# Patient Record
Sex: Female | Born: 1945
Health system: Southern US, Community
[De-identification: ages and names within clinical notes are randomized; demographics above are authoritative.]

## PROBLEM LIST (undated history)

## (undated) DIAGNOSIS — C801 Malignant (primary) neoplasm, unspecified: Secondary | ICD-10-CM

## (undated) DIAGNOSIS — R52 Pain, unspecified: Secondary | ICD-10-CM

## (undated) DIAGNOSIS — I209 Angina pectoris, unspecified: Secondary | ICD-10-CM

## (undated) DIAGNOSIS — K649 Unspecified hemorrhoids: Secondary | ICD-10-CM

## (undated) DIAGNOSIS — E7800 Pure hypercholesterolemia, unspecified: Secondary | ICD-10-CM

## (undated) DIAGNOSIS — K635 Polyp of colon: Secondary | ICD-10-CM

## (undated) DIAGNOSIS — F419 Anxiety disorder, unspecified: Secondary | ICD-10-CM

## (undated) DIAGNOSIS — C4490 Unspecified malignant neoplasm of skin, unspecified: Secondary | ICD-10-CM

## (undated) DIAGNOSIS — M1990 Unspecified osteoarthritis, unspecified site: Secondary | ICD-10-CM

## (undated) DIAGNOSIS — E785 Hyperlipidemia, unspecified: Secondary | ICD-10-CM

## (undated) DIAGNOSIS — K219 Gastro-esophageal reflux disease without esophagitis: Secondary | ICD-10-CM

## (undated) HISTORY — DX: Anxiety disorder, unspecified: F41.9

## (undated) HISTORY — DX: Unspecified hemorrhoids: K64.9

## (undated) HISTORY — PX: TOTAL HIP ARTHROPLASTY: SHX124

## (undated) HISTORY — DX: Pure hypercholesterolemia, unspecified: E78.00

## (undated) HISTORY — DX: Unspecified malignant neoplasm of skin, unspecified: C44.90

## (undated) HISTORY — DX: Pain, unspecified: R52

---

## 1998-05-31 DIAGNOSIS — C801 Malignant (primary) neoplasm, unspecified: Secondary | ICD-10-CM

## 1998-05-31 HISTORY — DX: Malignant (primary) neoplasm, unspecified: C80.1

## 1998-05-31 HISTORY — PX: ABDOMINAL HYSTERECTOMY: SHX81

## 1998-11-24 HISTORY — PX: TOTAL ABDOMINAL HYSTERECTOMY W/ BILATERAL SALPINGOOPHORECTOMY: SHX83

## 1999-01-27 ENCOUNTER — Ambulatory Visit: Admission: RE | Admit: 1999-01-27 | Discharge: 1999-01-27 | Payer: Self-pay | Admitting: Gynecology

## 2000-11-14 ENCOUNTER — Encounter: Payer: Self-pay | Admitting: Obstetrics and Gynecology

## 2000-11-14 ENCOUNTER — Ambulatory Visit (HOSPITAL_COMMUNITY): Admission: RE | Admit: 2000-11-14 | Discharge: 2000-11-14 | Payer: Self-pay | Admitting: Obstetrics and Gynecology

## 2001-11-20 ENCOUNTER — Ambulatory Visit (HOSPITAL_COMMUNITY): Admission: RE | Admit: 2001-11-20 | Discharge: 2001-11-20 | Payer: Self-pay | Admitting: Obstetrics & Gynecology

## 2001-11-20 ENCOUNTER — Encounter: Payer: Self-pay | Admitting: Obstetrics & Gynecology

## 2002-12-05 ENCOUNTER — Encounter: Payer: Self-pay | Admitting: Obstetrics & Gynecology

## 2002-12-05 ENCOUNTER — Ambulatory Visit (HOSPITAL_COMMUNITY): Admission: RE | Admit: 2002-12-05 | Discharge: 2002-12-05 | Payer: Self-pay | Admitting: Obstetrics & Gynecology

## 2002-12-14 ENCOUNTER — Encounter: Payer: Self-pay | Admitting: Internal Medicine

## 2002-12-14 ENCOUNTER — Ambulatory Visit (HOSPITAL_COMMUNITY): Admission: RE | Admit: 2002-12-14 | Discharge: 2002-12-14 | Payer: Self-pay | Admitting: Internal Medicine

## 2002-12-17 ENCOUNTER — Encounter: Payer: Self-pay | Admitting: Internal Medicine

## 2002-12-17 ENCOUNTER — Ambulatory Visit (HOSPITAL_COMMUNITY): Admission: RE | Admit: 2002-12-17 | Discharge: 2002-12-17 | Payer: Self-pay | Admitting: Internal Medicine

## 2003-12-16 ENCOUNTER — Ambulatory Visit (HOSPITAL_COMMUNITY): Admission: RE | Admit: 2003-12-16 | Discharge: 2003-12-16 | Payer: Self-pay | Admitting: Obstetrics and Gynecology

## 2004-10-16 ENCOUNTER — Inpatient Hospital Stay (HOSPITAL_COMMUNITY): Admission: RE | Admit: 2004-10-16 | Discharge: 2004-10-20 | Payer: Self-pay | Admitting: Orthopedic Surgery

## 2004-12-17 ENCOUNTER — Ambulatory Visit (HOSPITAL_COMMUNITY): Admission: RE | Admit: 2004-12-17 | Discharge: 2004-12-17 | Payer: Self-pay | Admitting: Internal Medicine

## 2005-12-20 ENCOUNTER — Ambulatory Visit (HOSPITAL_COMMUNITY): Admission: RE | Admit: 2005-12-20 | Discharge: 2005-12-20 | Payer: Self-pay | Admitting: Internal Medicine

## 2006-02-14 ENCOUNTER — Ambulatory Visit: Payer: Self-pay | Admitting: Internal Medicine

## 2006-02-14 ENCOUNTER — Ambulatory Visit (HOSPITAL_COMMUNITY): Admission: RE | Admit: 2006-02-14 | Discharge: 2006-02-14 | Payer: Self-pay | Admitting: Internal Medicine

## 2007-01-03 ENCOUNTER — Ambulatory Visit (HOSPITAL_COMMUNITY): Admission: RE | Admit: 2007-01-03 | Discharge: 2007-01-03 | Payer: Self-pay | Admitting: Internal Medicine

## 2007-05-04 ENCOUNTER — Ambulatory Visit (HOSPITAL_COMMUNITY): Admission: RE | Admit: 2007-05-04 | Discharge: 2007-05-04 | Payer: Self-pay | Admitting: Orthopedic Surgery

## 2007-12-24 ENCOUNTER — Emergency Department (HOSPITAL_COMMUNITY): Admission: EM | Admit: 2007-12-24 | Discharge: 2007-12-24 | Payer: Self-pay | Admitting: Emergency Medicine

## 2008-01-17 ENCOUNTER — Ambulatory Visit (HOSPITAL_COMMUNITY): Admission: RE | Admit: 2008-01-17 | Discharge: 2008-01-17 | Payer: Self-pay | Admitting: Internal Medicine

## 2008-01-24 ENCOUNTER — Other Ambulatory Visit: Admission: RE | Admit: 2008-01-24 | Discharge: 2008-01-24 | Payer: Self-pay | Admitting: Obstetrics and Gynecology

## 2008-01-26 ENCOUNTER — Ambulatory Visit (HOSPITAL_COMMUNITY): Admission: RE | Admit: 2008-01-26 | Discharge: 2008-01-26 | Payer: Self-pay | Admitting: Obstetrics & Gynecology

## 2008-01-29 ENCOUNTER — Encounter (HOSPITAL_COMMUNITY)
Admission: RE | Admit: 2008-01-29 | Discharge: 2008-02-28 | Payer: Self-pay | Admitting: Physical Medicine and Rehabilitation

## 2008-03-01 ENCOUNTER — Encounter (HOSPITAL_COMMUNITY)
Admission: RE | Admit: 2008-03-01 | Discharge: 2008-03-31 | Payer: Self-pay | Admitting: Physical Medicine and Rehabilitation

## 2008-04-01 ENCOUNTER — Encounter (HOSPITAL_COMMUNITY)
Admission: RE | Admit: 2008-04-01 | Discharge: 2008-04-17 | Payer: Self-pay | Admitting: Physical Medicine and Rehabilitation

## 2008-04-29 ENCOUNTER — Ambulatory Visit (HOSPITAL_COMMUNITY): Admission: RE | Admit: 2008-04-29 | Discharge: 2008-04-29 | Payer: Self-pay | Admitting: Internal Medicine

## 2009-05-31 HISTORY — PX: EYE SURGERY: SHX253

## 2009-06-20 ENCOUNTER — Ambulatory Visit (HOSPITAL_COMMUNITY): Admission: RE | Admit: 2009-06-20 | Discharge: 2009-06-20 | Payer: Self-pay | Admitting: Internal Medicine

## 2009-06-25 ENCOUNTER — Ambulatory Visit (HOSPITAL_COMMUNITY): Admission: RE | Admit: 2009-06-25 | Discharge: 2009-06-25 | Payer: Self-pay | Admitting: Internal Medicine

## 2009-06-26 ENCOUNTER — Ambulatory Visit (HOSPITAL_COMMUNITY): Admission: RE | Admit: 2009-06-26 | Discharge: 2009-06-26 | Payer: Self-pay | Admitting: Internal Medicine

## 2009-12-12 ENCOUNTER — Inpatient Hospital Stay (HOSPITAL_COMMUNITY): Admission: RE | Admit: 2009-12-12 | Discharge: 2009-12-15 | Payer: Self-pay | Admitting: Orthopedic Surgery

## 2010-01-02 ENCOUNTER — Encounter (HOSPITAL_COMMUNITY)
Admission: RE | Admit: 2010-01-02 | Discharge: 2010-02-01 | Payer: Self-pay | Source: Home / Self Care | Admitting: Orthopedic Surgery

## 2010-04-16 ENCOUNTER — Other Ambulatory Visit: Admission: RE | Admit: 2010-04-16 | Discharge: 2010-04-16 | Payer: Self-pay | Admitting: Obstetrics and Gynecology

## 2010-06-21 ENCOUNTER — Encounter (INDEPENDENT_AMBULATORY_CARE_PROVIDER_SITE_OTHER): Payer: Self-pay | Admitting: Internal Medicine

## 2010-06-22 ENCOUNTER — Ambulatory Visit (HOSPITAL_COMMUNITY)
Admission: RE | Admit: 2010-06-22 | Discharge: 2010-06-22 | Payer: Self-pay | Source: Home / Self Care | Attending: Internal Medicine | Admitting: Internal Medicine

## 2010-08-15 LAB — CBC
HCT: 25.9 % — ABNORMAL LOW (ref 36.0–46.0)
HCT: 27.2 % — ABNORMAL LOW (ref 36.0–46.0)
Hemoglobin: 8.7 g/dL — ABNORMAL LOW (ref 12.0–15.0)
MCHC: 34 g/dL (ref 30.0–36.0)
MCV: 94.8 fL (ref 78.0–100.0)
MCV: 95.1 fL (ref 78.0–100.0)
Platelets: 139 10*3/uL — ABNORMAL LOW (ref 150–400)
Platelets: 144 10*3/uL — ABNORMAL LOW (ref 150–400)
RBC: 2.86 MIL/uL — ABNORMAL LOW (ref 3.87–5.11)
RDW: 14 % (ref 11.5–15.5)
RDW: 14.2 % (ref 11.5–15.5)
RDW: 14.6 % (ref 11.5–15.5)
WBC: 8.4 10*3/uL (ref 4.0–10.5)
WBC: 8.8 10*3/uL (ref 4.0–10.5)

## 2010-08-15 LAB — PROTIME-INR
INR: 1.13 (ref 0.00–1.49)
INR: 1.56 — ABNORMAL HIGH (ref 0.00–1.49)
INR: 1.8 — ABNORMAL HIGH (ref 0.00–1.49)
Prothrombin Time: 14.4 seconds (ref 11.6–15.2)
Prothrombin Time: 18.5 seconds — ABNORMAL HIGH (ref 11.6–15.2)

## 2010-08-15 LAB — BASIC METABOLIC PANEL
BUN: 6 mg/dL (ref 6–23)
BUN: 6 mg/dL (ref 6–23)
CO2: 31 mEq/L (ref 19–32)
Calcium: 7.9 mg/dL — ABNORMAL LOW (ref 8.4–10.5)
Chloride: 102 mEq/L (ref 96–112)
Chloride: 97 mEq/L (ref 96–112)
Chloride: 99 mEq/L (ref 96–112)
Creatinine, Ser: 0.68 mg/dL (ref 0.4–1.2)
Creatinine, Ser: 0.76 mg/dL (ref 0.4–1.2)
GFR calc Af Amer: >60 mL/min (ref >60)
GFR calc Af Amer: >60 mL/min (ref >60)
Potassium: 3.2 mEq/L — ABNORMAL LOW (ref 3.5–5.1)
Potassium: 4 mEq/L (ref 3.5–5.1)
Sodium: 130 mEq/L — ABNORMAL LOW (ref 135–145)

## 2010-08-16 LAB — CBC
HCT: 41.9 % (ref 36.0–46.0)
Hemoglobin: 14.1 g/dL (ref 12.0–15.0)
RDW: 14.4 % (ref 11.5–15.5)
WBC: 9.8 10*3/uL (ref 4.0–10.5)

## 2010-08-16 LAB — URINALYSIS, ROUTINE W REFLEX MICROSCOPIC
Glucose, UA: NEGATIVE mg/dL
Hgb urine dipstick: NEGATIVE
Specific Gravity, Urine: 1.034 — ABNORMAL HIGH (ref 1.005–1.030)
pH: 6 (ref 5.0–8.0)

## 2010-08-16 LAB — COMPREHENSIVE METABOLIC PANEL
ALT: 31 U/L (ref 0–35)
AST: 24 U/L (ref 0–37)
Albumin: 3.9 g/dL (ref 3.5–5.2)
Alkaline Phosphatase: 70 U/L (ref 39–117)
GFR calc Af Amer: >60 mL/min (ref >60)
Glucose, Bld: 67 mg/dL — ABNORMAL LOW (ref 70–99)
Potassium: 4.4 mEq/L (ref 3.5–5.1)
Sodium: 142 mEq/L (ref 135–145)
Total Protein: 6.4 g/dL (ref 6.0–8.3)

## 2010-08-16 LAB — DIFFERENTIAL
Basophils Relative: 1 % (ref 0–1)
Eosinophils Absolute: 0.2 10*3/uL (ref 0.0–0.7)
Eosinophils Relative: 2 % (ref 0–5)
Monocytes Absolute: 0.8 10*3/uL (ref 0.1–1.0)
Monocytes Relative: 8 % (ref 3–12)
Neutrophils Relative %: 62 % (ref 43–77)

## 2010-08-16 LAB — URINE MICROSCOPIC-ADD ON

## 2010-08-16 LAB — SURGICAL PCR SCREEN: MRSA, PCR: NEGATIVE

## 2010-08-16 LAB — TYPE AND SCREEN: ABO/RH(D): A POS

## 2010-10-16 NOTE — Discharge Summary (Signed)
Tami Love, Tami Love NO.:  [id number]   MEDICAL RECORD NO.:  [id number]          PATIENT TYPE:  INP   LOCATION:  5031                         FACILITY:  [facility]   PHYSICIAN:  Harvie Junior, M.D.   DATE OF BIRTH:  1946/04/20   DATE OF ADMISSION:  10/16/2004  DATE OF DISCHARGE:  10/20/2004                                 DISCHARGE SUMMARY   ADMISSION DIAGNOSES:  1.  End-stage degenerative joint disease/avascular necrosis, left hip.  2.  Gastroesophageal reflux disease.  3.  Hyperlipidemia.   DISCHARGE DIAGNOSES:  1.  End-stage degenerative joint disease/avascular necrosis, left hip.  2.  Gastroesophageal reflux disease.  3.  Hyperlipidemia.   ALLERGIES:  IVP DYE causes swelling.   PROCEDURE:  Left total hip arthroplasty with S-ROM prosthesis, Jodi Geralds,  M.D. on Oct 16, 2004.   HISTORY OF PRESENT ILLNESS:  Tami Love is a pleasant, 65 year old patient  who presented to our office with a long history of progressive left hip pain  in the groin and limitation of motion.  She had night pain and pain with  ambulation that has gotten progressively worse over the past few years and  in the past few months it has gotten to the point where she was ready to get  it taken care of.  She tried modifying her activity and use of over-the-  counter antiinflammatory medications, but continued to have pain in her hip.  Her x-ray finding showed bone on bone degenerative arthritis of the left hip  with some findings suggesting avascular necrosis of the femoral head.  Based  upon the radiographic and clinical findings, she was felt to be a candidate  for a left total hip arthroplasty and she was admitted for this.   LABORATORY DATA AND X-RAY FINDINGS:  EKG on admission showed normal sinus  rhythm with nonspecific ST and T wave changes.  No previous tracing to  compare.  Postoperative x-ray of the left hip showed left total hip  arthroplasty without complications.   Hemoglobin on admission was 12.9, hematocrit 38.3 and indices within normal  limits.  On postop day #1, hemoglobin 10.1, postop day #2, 9.5, postop day  #3, 9.1.  Protime on admission was 12.3 seconds with an INR of 0.9.  Her PTT  was 24.  On the date of discharge, on Coumadin therapy, her protime was 19.1  seconds with an INR of 2.0.  CMET on admission was within normal limits.  Urinalysis on admission showed no abnormalities other than moderate  leukocyte esterase, few epithelials and 3-6 wbc's per high power field with  few bacteria.   HOSPITAL COURSE:  The patient was brought to the operating room on Oct 16, 2004.  Preoperatively, she was given 1 g of Ancef and gentamicin 80 mg IV.  She was brought to the operating room where she underwent left total hip  arthroplasty as well described in Dr. Luiz Blare' operative note.  She was put  on PCA morphine pump for pain control and was given 5 doses of IV Ancef 1 g  q.8h.  Physical therapy evaluated  the patient for walker ambulation with  weightbearing as tolerated on the left.  On postop day #1, she is resting  comfortably.  She had no complaints.  Her vital signs are stable.  She is  afebrile.  Her INR was 1.0 and her hemoglobin was 10.1.  She had good  neurovascular status to her left lower extremity.  She has gotten out of bed  to the chair.  Incentive spirometry was used postoperatively.  On postop day  #2, the patient was resting comfortably.  She had spiked a fever up to 103.4  and was then found to be afebrile.  Her hemoglobin was stable at 9.5 and her  INR was 1.6 on Coumadin therapy.  Her incision was clean and dry.  Her PCA  morphine pump was discontinued and her IV was converted to a saline lock.  Her dressing was changed.  She overall was doing well on postop day #3 with  a stable hemoglobin and INR of 1.6.  Her Percocet was changed to p.o.  Demerol for a little better pain control.  On postop day #4, she had minimal  hip pain.  She  was taken fluids and voiding without difficulty.  Her vital  signs are stable.  She is afebrile.  Temperature was 99.3 at this point.  Her left hip wound was benign.  Neurovascular status was intact to the left  lower extremity.  Her INR was 2.0.  Her dressing was changed.  She was  discharged home in improved condition.   DIET:  Regular.   DISCHARGE MEDICATIONS:  1.  Mepergan Fortis p.r.n. pain one to two q.6h. p.r.n. pain.  2.  Coumadin x1 month postop for DVT prophylaxis and this will be managed by      home health agency.   ACTIVITY:  She will need home health physical therapy.  She is instructed to  ambulate weightbearing as tolerated on the left with a walker.  Keep her  wound dry.   FOLLOW UP:  Follow up with Dr. Luiz Blare in his office in 10 days.  Call with  any problems that occur.      Marshia Ly, P.A.      Harvie Junior, M.D.  Electronically Signed    JB/MEDQ  D:  12/23/2004  T:  12/23/2004  Job:  [id number]   cc:   Kingsley Callander. Ouida Sills, MD  [address]  [place]  [place] [address]  Fax: [phone]

## 2010-10-16 NOTE — Op Note (Signed)
[name], Love NO.:  [id number]   MEDICAL RECORD NO.:  [id number]          PATIENT TYPE:  INP   LOCATION:  2550                         FACILITY:  [facility]   PHYSICIAN:  Harvie Junior, M.D.   DATE OF BIRTH:  [birth year]   DATE OF PROCEDURE:  10/16/2004  DATE OF DISCHARGE:                                 OPERATIVE REPORT   PREOPERATIVE DIAGNOSIS:  End-stage degenerative joint disease, left hip with  arteriovenous malformation.   POSTOPERATIVE DIAGNOSIS:  End-stage degenerative joint disease, left hip  with arteriovenous malformation.   PRINCIPAL PROCEDURE:  Left total hip replacement; with Laural Benes [employer] Laural Benes S-  ROM prosthesis, a 50 mm cup, a 50 mm 10-degree hooded liner, a 28 mm  Plus-0  hip ball, a 36 Plus-12 offset 1813 stem.   SURGEON:  Harvie Junior, M.D.   ASSISTANT:  Marshia Ly, P.A.   ANESTHESIA:  General.   BRIEF HISTORY:  The patient is a 65 year old female with a long history of  having significant left hip pain.  She had been followed for a long period  of time conservatively because of continued complaints of pain related to  AVM.  She is now taken to the operating room for left total hip replacement  after failure of all conservative care.   DESCRIPTION OF PROCEDURE:  The patient taken to the operating room and after  general endotracheal anesthesia obtained, the patient placed on operating  room table and the left hip prepped and draped in the usual sterile fashion.  After the patient had been rolled over the right lateral decubitus position,  all bony prominences were well padded and axillary roll had been put in  place.  Attention then turned to the left hip, where a lateral incision was  made for a posterior approach to the hip. Subcutaneous tissues were  dissected down to the level of the tensor fascia, divided in line with its  fibers; and a posterior approach to the hip was made.  Short external  rotators and piriform were  intact.  Attention was then turned towards the  posterior aspect of the hip.  The capsule was taken down and tagged.  The  hip ball was then cut; the neck was cut.  This was a fairly low neck cut, as  this had been tem plated as necessary to re-establish the hip center.  Once  this was accomplished, the attention was turned towards the reaming of the  distal canal.  The acetabulum was initially sequentially reamed to a level  of 49 of 50.  A cup was put in place.  We initially wanted to get to 52, but  just was not bone stock to handle this, and at 49 was the last reamer.  A 50  cup was hammered into place and 45-degrees of lateral opening and 30 degrees  of anteversion.  Following this, attention was turned towards the stem,  which was sequentially reamed up to a level of 13, and a 13.5 reamer was  taken two-thirds of the way down.  Attention was then turned proximally,  where an 18-B large cone was in fact used, and then an 18-B trial was put in  place.  The Plus-8 stem was used, and this did give some tendency towards  dislocation; which did not appear too tight laterally, so a Plus-12 stem was  used -- this gave excellent range of motion and stability.  At that point  the final components were put in place.  A 10-degree lip liner posteriorly,  a 36 Plus-12 stem with a small degree of anteversion, and a 36 Plus-12 stem  was hammered into place.  Excellent stability was achieved, and the final  range of motion was excellent, with no tendency towards instability.   At this point the wound was copiously irrigated and suctioned dry.  The  short external rotators and posterior capsule were repaired to the posterior  trochanteric line with Ethibond interrupted sutures.  The tensor fascia with  a long Vicryl running suture; the skin with 0 and 2-0 Vicryl, and the skin  with skin staples.  A sterile compressive dressing was applied, as well as a  knee immobilizer.   The patient was taken to  the recovery room and noted to be in satisfactory  condition.   ESTIMATED BLOOD LOSS:  250 cc.       JLG/MEDQ  D:  10/16/2004  T:  10/16/2004  Job:  [id number]

## 2010-10-16 NOTE — Op Note (Signed)
NAMEKIMBERLIE, Tami Love               ACCOUNT NO.:  [id number]   MEDICAL RECORD NO.:  [id number]          PATIENT TYPE:  AMB   LOCATION:  DAY                           FACILITY:  [facility]   PHYSICIAN:  Lionel December, M.D.    DATE OF BIRTH:  [birth year]   DATE OF PROCEDURE:  02/14/2006  DATE OF DISCHARGE:                                 OPERATIVE REPORT   PROCEDURE:  Colonoscopy.   INDICATION:  Kailynn is a 65 year old Caucasian female with history of  colonic polyps, whose last exam was about 5 years ago under fluoroscopy.  The procedures were reviewed with the patient and informed consent was  obtained.   MEDICATIONS FOR CONSCIOUS SEDATION:  Demerol 50 mg IV, Versed 8 mg IV.   FINDINGS:  Procedure performed in endoscopy suite.  The patient's vital  signs and O2 sat were monitored during the procedure and remained stable.  The patient was placed in left lateral recumbent position.  Rectal  examination performed.  No abnormality noted in external or digital exam.  Olympus videoscope was placed in rectum, advanced under vision into sigmoid  colon where there were a few pieces of formed stool and multiple  diverticula.  Scope was repeatedly withdrawn with the torque to keep it  straight.  Scope was passed into cecum, which was identified by appendiceal  orifice and ileocecal valve.  Pictures taken for the record.  As the scope  was withdrawn, colonic mucosa was carefully examined and there were no  polyps or tumor masses noted.  Rectal mucosa was normal.  Scope was  retroflexed to examine anorectal junction which was unremarkable.  Endoscope  was straightened and withdrawn.  The patient tolerated the procedure well.   FINAL DIAGNOSES:  1. No evidence of recurrent polyps.  2. Extensive sigmoid colon diverticulosis.   RECOMMENDATIONS:  She should continue yearly Hemoccults and consider next  exam in 5-7 years.   No evidence of recurrent polyps.  Extensive sigmoid colon diverticula.   Sigmoid diverticulosis.   RECOMMENDATIONS:  1. Yearly Hemoccults.  2. High-fiber diet with fiber supplement 3-4 grams daily.  3. Next exam in 5-7 years from now.      Lionel December, M.D.  Electronically Signed     NR/MEDQ  D:  02/14/2006  T:  02/14/2006  Job:  [id number]   cc:   Kingsley Callander. Ouida Sills, MD  Fax: [phone]

## 2010-11-30 ENCOUNTER — Other Ambulatory Visit (HOSPITAL_COMMUNITY): Payer: Self-pay | Admitting: Internal Medicine

## 2010-11-30 DIAGNOSIS — N644 Mastodynia: Secondary | ICD-10-CM

## 2010-12-16 ENCOUNTER — Ambulatory Visit (HOSPITAL_COMMUNITY)
Admission: RE | Admit: 2010-12-16 | Discharge: 2010-12-16 | Disposition: A | Discharge status: Home / Self Care | Payer: Medicare Other | Source: Ambulatory Visit | Attending: Internal Medicine | Admitting: Internal Medicine

## 2010-12-16 DIAGNOSIS — N644 Mastodynia: Secondary | ICD-10-CM

## 2011-02-02 ENCOUNTER — Encounter (INDEPENDENT_AMBULATORY_CARE_PROVIDER_SITE_OTHER): Payer: Self-pay | Admitting: *Deleted

## 2011-02-04 ENCOUNTER — Telehealth (INDEPENDENT_AMBULATORY_CARE_PROVIDER_SITE_OTHER): Payer: Self-pay | Admitting: *Deleted

## 2011-02-04 DIAGNOSIS — Z86010 Personal history of colonic polyps: Secondary | ICD-10-CM

## 2011-02-04 NOTE — Telephone Encounter (Addendum)
Waiting for NUR to sign triage and let me know which type of prep patients needs, then will need to mail & e-scribe  TCS sch'd 04/29/11 @ 9:30 (8:30)

## 2011-02-26 LAB — DIFFERENTIAL
Basophils Relative: 0
Eosinophils Absolute: 0.3
Eosinophils Relative: 3
Lymphs Abs: 2.9
Monocytes Relative: 8
Neutrophils Relative %: 51

## 2011-02-26 LAB — BASIC METABOLIC PANEL
BUN: 19
CO2: 29
Chloride: 108
Creatinine, Ser: 0.69
Potassium: 4.1

## 2011-02-26 LAB — POCT CARDIAC MARKERS: Myoglobin, poc: 31.1

## 2011-02-26 LAB — CBC
HCT: 38.6
MCHC: 33.3
MCV: 93.4
Platelets: 237
RBC: 4.13

## 2011-03-16 ENCOUNTER — Encounter (INDEPENDENT_AMBULATORY_CARE_PROVIDER_SITE_OTHER): Payer: Self-pay | Admitting: *Deleted

## 2011-03-16 ENCOUNTER — Telehealth (INDEPENDENT_AMBULATORY_CARE_PROVIDER_SITE_OTHER): Payer: Self-pay | Admitting: *Deleted

## 2011-03-16 NOTE — Telephone Encounter (Signed)
Per dr Karilyn Cota ok to have osmo pill prep, instructions mailed

## 2011-03-16 NOTE — Telephone Encounter (Signed)
Patient need osmo pill prep

## 2011-03-17 ENCOUNTER — Other Ambulatory Visit (INDEPENDENT_AMBULATORY_CARE_PROVIDER_SITE_OTHER): Payer: Self-pay | Admitting: Internal Medicine

## 2011-03-17 MED ORDER — SOD PHOS MONO-SOD PHOS DIBASIC 1.102-0.398 G PO TABS: 1.0000 | ORAL_TABLET | Freq: Once | ORAL | Status: DC

## 2011-03-17 NOTE — Telephone Encounter (Signed)
Will resend

## 2011-04-05 ENCOUNTER — Other Ambulatory Visit (INDEPENDENT_AMBULATORY_CARE_PROVIDER_SITE_OTHER): Payer: Self-pay | Admitting: *Deleted

## 2011-04-05 DIAGNOSIS — Z86010 Personal history of colonic polyps: Secondary | ICD-10-CM

## 2011-04-16 ENCOUNTER — Encounter (HOSPITAL_COMMUNITY): Payer: Self-pay | Admitting: Pharmacy Technician

## 2011-04-20 ENCOUNTER — Telehealth (INDEPENDENT_AMBULATORY_CARE_PROVIDER_SITE_OTHER): Payer: Self-pay | Admitting: *Deleted

## 2011-04-20 NOTE — Telephone Encounter (Signed)
PCP/Requesting MD:  Ouida Sills  Name: Tami Love  DOB: [birth year]  Home Phone: [phone]      Procedure: TCS  Reason/Indication:  SURVEILLANCE, HX POLYPS  Has patient had this procedure before?  YES  If so, when, by whom and where?  9/07  Is there a family history of colon cancer?  NO  Who?  What age when diagnosed?    Is patient diabetic?   NO      Does patient have prosthetic heart valve?  NO  Do you have a pacemaker?  NO  Has patient had joint replacement within last 12 months?  NO  Is patient on Coumadin, Plavix and/or Aspirin? NO  Medications: CITALOPRAM 20 MG DAILY, SIMVASTATIN 40 MG DAILY, OMEPRAZOLE 20 MG DAILY, EXTRA STRENGTH TYLENOL DAILY  Allergies: IVP DYE  Pharmacy: [facility]  Medication Adjustment: NONE  Procedure date & time: 04/29/11 @ 9:30  Patient stated she thinks last TCS was under fluoro, if so, will this one need to be with fluoro, per Dr Karilyn Cota last TCS was not under fluoro therefore not needed.Marland KitchenMarland KitchenMarland Kitchen

## 2011-04-21 NOTE — Telephone Encounter (Signed)
Agree with colonoscopy.

## 2011-04-26 ENCOUNTER — Other Ambulatory Visit: Payer: Self-pay | Admitting: Adult Health

## 2011-04-26 ENCOUNTER — Other Ambulatory Visit (HOSPITAL_COMMUNITY)
Admission: RE | Admit: 2011-04-26 | Discharge: 2011-04-26 | Disposition: A | Discharge status: Home / Self Care | Payer: Medicare Other | Source: Ambulatory Visit | Attending: Obstetrics and Gynecology | Admitting: Obstetrics and Gynecology

## 2011-04-26 DIAGNOSIS — Z124 Encounter for screening for malignant neoplasm of cervix: Secondary | ICD-10-CM | POA: Insufficient documentation

## 2011-04-28 MED ORDER — SODIUM CHLORIDE 0.45 % IV SOLN
Freq: Once | INTRAVENOUS | Status: AC
  Administered 2011-04-29: 09:00:00 via INTRAVENOUS

## 2011-04-29 ENCOUNTER — Encounter (HOSPITAL_COMMUNITY)
Admission: RE | Disposition: A | Discharge status: Home / Self Care | Payer: Self-pay | Source: Ambulatory Visit | Attending: Internal Medicine

## 2011-04-29 ENCOUNTER — Other Ambulatory Visit (INDEPENDENT_AMBULATORY_CARE_PROVIDER_SITE_OTHER): Payer: Self-pay | Admitting: Internal Medicine

## 2011-04-29 ENCOUNTER — Ambulatory Visit (HOSPITAL_COMMUNITY)
Admission: RE | Admit: 2011-04-29 | Discharge: 2011-04-29 | Disposition: A | Discharge status: Home / Self Care | Payer: Medicare Other | Source: Ambulatory Visit | Attending: Internal Medicine | Admitting: Internal Medicine

## 2011-04-29 ENCOUNTER — Encounter (HOSPITAL_COMMUNITY): Payer: Self-pay | Admitting: *Deleted

## 2011-04-29 DIAGNOSIS — D126 Benign neoplasm of colon, unspecified: Secondary | ICD-10-CM | POA: Insufficient documentation

## 2011-04-29 DIAGNOSIS — Z860100 Personal history of colon polyps, unspecified: Secondary | ICD-10-CM | POA: Insufficient documentation

## 2011-04-29 DIAGNOSIS — E785 Hyperlipidemia, unspecified: Secondary | ICD-10-CM | POA: Insufficient documentation

## 2011-04-29 DIAGNOSIS — K5730 Diverticulosis of large intestine without perforation or abscess without bleeding: Secondary | ICD-10-CM | POA: Insufficient documentation

## 2011-04-29 DIAGNOSIS — Z09 Encounter for follow-up examination after completed treatment for conditions other than malignant neoplasm: Secondary | ICD-10-CM | POA: Insufficient documentation

## 2011-04-29 HISTORY — DX: Gastro-esophageal reflux disease without esophagitis: K21.9

## 2011-04-29 HISTORY — DX: Hyperlipidemia, unspecified: E78.5

## 2011-04-29 HISTORY — PX: COLONOSCOPY: SHX5424

## 2011-04-29 HISTORY — DX: Angina pectoris, unspecified: I20.9

## 2011-04-29 HISTORY — DX: Polyp of colon: K63.5

## 2011-04-29 SURGERY — COLONOSCOPY
Anesthesia: Moderate Sedation

## 2011-04-29 MED ORDER — MIDAZOLAM HCL 5 MG/5ML IJ SOLN
INTRAMUSCULAR | Status: DC | PRN
  Administered 2011-04-29 (×2): 2 mg via INTRAVENOUS
  Administered 2011-04-29: 1 mg via INTRAVENOUS

## 2011-04-29 MED ORDER — MIDAZOLAM HCL 5 MG/5ML IJ SOLN
INTRAMUSCULAR | Status: AC
  Filled 2011-04-29: qty 10

## 2011-04-29 MED ORDER — STERILE WATER FOR IRRIGATION IR SOLN
Status: DC | PRN
  Administered 2011-04-29: 09:00:00

## 2011-04-29 MED ORDER — MEPERIDINE HCL 50 MG/ML IJ SOLN
INTRAMUSCULAR | Status: AC
  Filled 2011-04-29: qty 1

## 2011-04-29 MED ORDER — MEPERIDINE HCL 50 MG/ML IJ SOLN
INTRAMUSCULAR | Status: DC | PRN
  Administered 2011-04-29 (×2): 25 mg via INTRAVENOUS

## 2011-04-29 NOTE — H&P (Signed)
Tami Love is an 65 y.o. female.   Chief Complaint: Patient is here for colonoscopy. HPI: Patient is 65 year old Caucasian female with history of colonic polyps. Her last exam was in September 2007 and was negative for colonic polyps. He denies abdominal pain rectal bleeding. She has irregular bowel movements; either diarrhea or constipation. Family  history is negative for colorectal carcinoma.  Past Medical History  Diagnosis Date  . Hyperlipidemia   . GERD (gastroesophageal reflux disease)   . Angina     remote history  . Colon polyps     Past Surgical History  Procedure Date  . Abdominal hysterectomy   . Total hip arthroplasty 2010, 2007    bilateral    Family History  Problem Relation Age of Onset  . Colon cancer Neg Hx    Social History:  reports that she has never smoked. She does not have any smokeless tobacco history on file. She reports that she drinks about 8.5 ounces of alcohol per week. She reports that she does not use illicit drugs.  Allergies:  Allergies  Allergen Reactions  . Iohexol   . Ivp Dye (Iodinated Diagnostic Agents)     Medications Prior to Admission  Medication Dose Route Frequency Provider Last Rate Last Dose  . 0.45 % sodium chloride infusion   Intravenous Once Malissa Hippo, MD 20 mL/hr at 04/29/11 0843    . meperidine (DEMEROL) 50 MG/ML injection           . midazolam (VERSED) 5 MG/5ML injection           . simethicone susp in sterile water 1000 mL irrigation    PRN Malissa Hippo, MD       No current outpatient prescriptions on file as of 04/29/2011.    No results found for this or any previous visit (from the past 48 hour(s)). No results found.  Review of Systems  Constitutional: Negative for weight loss.  Gastrointestinal: Negative for abdominal pain, blood in stool and melena.    Blood pressure 148/66, pulse 62, temperature 98.1 F (36.7 C), temperature source Oral, resp. rate 16, height 5' 6.5" (1.689 m), weight 185 lb  (83.915 kg), SpO2 100.00%. Physical Exam  Constitutional: She appears well-developed and well-nourished.  HENT:  Mouth/Throat: Oropharynx is clear and moist.  Eyes: Conjunctivae are normal. No scleral icterus.  Neck: No thyromegaly present.  Cardiovascular: Regular rhythm and normal heart sounds.   No murmur heard. Respiratory: Effort normal and breath sounds normal.  GI: Soft. She exhibits no distension and no mass. There is no tenderness.  Musculoskeletal: She exhibits no edema.  Lymphadenopathy:    She has no cervical adenopathy.  Neurological: She is alert.  Skin: Skin is warm and dry.     Assessment/Plan History of colonic adenomas. Surveillance colonoscopy  Tami Love U 04/29/2011, 9:30 AM

## 2011-04-29 NOTE — Op Note (Signed)
COLONOSCOPY PROCEDURE REPORT  PATIENT:  Tami Love  MR#:  [id number] Birthdate:  18-Oct-1945, 65 y.o., female Endoscopist:  Dr. Malissa Hippo, MD Referred By:  Dr. Carylon Perches, MD Procedure Date: 04/29/2011  Procedure:   Colonoscopy  Indications:  Patient is 65 year old Caucasian female with history of colonic colonic adenomas who is here for surveillance examination. Her last exam was in September 2007.  Informed Consent: Procedure and risks were reviewed with the patient and informed consent was obtained.  Medications:  Demerol 50 mg IV Versed 5 mg IV  Description of procedure:  After a digital rectal exam was performed, that colonoscope was advanced from the anus through the rectum and colon to the area of the cecum, ileocecal valve and appendiceal orifice. The cecum was deeply intubated. These structures were well-seen and photographed for the record. From the level of the cecum and ileocecal valve, the scope was slowly and cautiously withdrawn. The mucosal surfaces were carefully surveyed utilizing scope tip to flexion to facilitate fold flattening as needed. The scope was pulled down into the rectum where a thorough exam including retroflexion was performed.  Findings:   Prep excellent except coating of cecal mucosa with stool landmarks well identified. Multiple diverticula at sigmoid colon and few more at hepatic flexure. Small polyp ablated via cold biopsy from splenic flexure. Another small polyp ablated via cold biopsy from sigmoid colon.  Therapeutic/Diagnostic Maneuvers Performed:  See above  Complications:  None  Cecal Withdrawal Time:  14 minutes  Impression:  Examination performed to cecum. Multiple diverticula at sigmoid colon and few more at hepatic flexure. Two small polyps ablated via cold biopsy; one from splenic flexure and the second one from sigmoid colon.  Recommendations:  Standard instructions given. I will be contacting patient with results of  biopsy and further recommendations.  Dominico Rod U  04/29/2011 10:08 AM  CC: Dr. Carylon Perches, MD & Dr. Bonnetta Barry ref. provider found

## 2011-05-10 ENCOUNTER — Encounter (INDEPENDENT_AMBULATORY_CARE_PROVIDER_SITE_OTHER): Payer: Self-pay | Admitting: *Deleted

## 2011-05-10 ENCOUNTER — Encounter (HOSPITAL_COMMUNITY): Payer: Self-pay | Admitting: Internal Medicine

## 2011-07-12 ENCOUNTER — Other Ambulatory Visit (HOSPITAL_COMMUNITY): Payer: Self-pay | Admitting: Internal Medicine

## 2011-07-12 DIAGNOSIS — Z139 Encounter for screening, unspecified: Secondary | ICD-10-CM

## 2011-07-15 ENCOUNTER — Ambulatory Visit (HOSPITAL_COMMUNITY)
Admission: RE | Admit: 2011-07-15 | Discharge: 2011-07-15 | Disposition: A | Discharge status: Home / Self Care | Payer: Medicare Other | Source: Ambulatory Visit | Attending: Internal Medicine | Admitting: Internal Medicine

## 2011-07-15 DIAGNOSIS — Z1231 Encounter for screening mammogram for malignant neoplasm of breast: Secondary | ICD-10-CM | POA: Insufficient documentation

## 2011-07-15 DIAGNOSIS — Z139 Encounter for screening, unspecified: Secondary | ICD-10-CM

## 2012-04-04 ENCOUNTER — Encounter (HOSPITAL_COMMUNITY): Payer: Self-pay | Admitting: Pharmacy Technician

## 2012-04-05 NOTE — Patient Instructions (Addendum)
Your procedure is scheduled on:  04/10/12  Report to [facility] at  11:30 AM.  Call this number if you have problems the morning of surgery: [phone]   Remember:   Do not eat or drink:After Midnight.  Take these medicines the morning of surgery with A SIP OF WATER: Omeprazole   Do not wear jewelry, make-up or nail polish.  Do not wear lotions, powders, or perfumes. You may wear deodorant.  Do not shave 48 hours prior to surgery. Men may shave face and neck.  Do not bring valuables to the hospital.  Contacts, dentures or bridgework may not be worn into surgery.  Leave suitcase in the car. After surgery it may be brought to your room.  For patients admitted to the hospital, checkout time is 11:00 AM the day of discharge.   Patients discharged the day of surgery will not be allowed to drive home.    Special Instructions: Start using your eye drops before surgery as directed by your eye doctor.   Please read over the following fact sheets that you were given: Anesthesia Post-op Instructions    Cataract Surgery  A cataract is a clouding of the lens of the eye. When a lens becomes cloudy, vision is reduced based on the degree and nature of the clouding. Surgery may be needed to improve vision. Surgery removes the cloudy lens and usually replaces it with a substitute lens (intraocular lens, IOL). LET YOUR EYE DOCTOR KNOW ABOUT:  Allergies to food or medicine.  Medicines taken including herbs, eyedrops, over-the-counter medicines, and creams.  Use of steroids (by mouth or creams).  Previous problems with anesthetics or numbing medicine.  History of bleeding problems or blood clots.  Previous surgery.  Other health problems, including diabetes and kidney problems.  Possibility of pregnancy, if this applies. RISKS AND COMPLICATIONS  Infection.  Inflammation of the eyeball (endophthalmitis) that can spread to both eyes (sympathetic ophthalmia).  Poor wound healing.  If an  IOL is inserted, it can later fall out of proper position. This is very uncommon.  Clouding of the part of your eye that holds an IOL in place. This is called an "after-cataract." These are uncommon, but easily treated. BEFORE THE PROCEDURE  Do not eat or drink anything except small amounts of water for 8 to 12 before your surgery, or as directed by your caregiver.  Unless you are told otherwise, continue any eyedrops you have been prescribed.  Talk to your primary caregiver about all other medicines that you take (both prescription and non-prescription). In some cases, you may need to stop or change medicines near the time of your surgery. This is most important if you are taking blood-thinning medicine.Do not stop medicines unless you are told to do so.  Arrange for someone to drive you to and from the procedure.  Do not put contact lenses in either eye on the day of your surgery. PROCEDURE There is more than one method for safely removing a cataract. Your doctor can explain the differences and help determine which is best for you. Phacoemulsification surgery is the most common form of cataract surgery.  An injection is given behind the eye or eyedrops are given to make this a painless procedure.  A small cut (incision) is made on the edge of the clear, dome-shaped surface that covers the front of the eye (cornea).  A tiny probe is painlessly inserted into the eye. This device gives off ultrasound waves that soften and break up  the cloudy center of the lens. This makes it easier for the cloudy lens to be removed by suction.  An IOL may be implanted.  The normal lens of the eye is covered by a clear capsule. Part of that capsule is intentionally left in the eye to support the IOL.  Your surgeon may or may not use stitches to close the incision. There are other forms of cataract surgery that require a larger incision and stiches to close the eye. This approach is taken in cases where  the doctor feels that the cataract cannot be easily removed using phacoemulsification. AFTER THE PROCEDURE  When an IOL is implanted, it does not need care. It becomes a permanent part of your eye and cannot be seen or felt.  Your doctor will schedule follow-up exams to check on your progress.  Review your other medicines with your doctor to see which can be resumed after surgery.  Use eyedrops or take medicine as prescribed by your doctor. Document Released: 05/06/2011 Document Revised: 08/09/2011 Document Reviewed: 05/06/2011 [employer] Patient Information 2013 [place], [place].    PATIENT INSTRUCTIONS POST-ANESTHESIA  IMMEDIATELY FOLLOWING SURGERY:  Do not drive or operate machinery for the first twenty four hours after surgery.  Do not make any important decisions for twenty four hours after surgery or while taking narcotic pain medications or sedatives.  If you develop intractable nausea and vomiting or a severe headache please notify your doctor immediately.  FOLLOW-UP:  Please make an appointment with your surgeon as instructed. You do not need to follow up with anesthesia unless specifically instructed to do so.  WOUND CARE INSTRUCTIONS (if applicable):  Keep a dry clean dressing on the anesthesia/puncture wound site if there is drainage.  Once the wound has quit draining you may leave it open to air.  Generally you should leave the bandage intact for twenty four hours unless there is drainage.  If the epidural site drains for more than 36-48 hours please call the anesthesia department.  QUESTIONS?:  Please feel free to call your physician or the hospital operator if you have any questions, and they will be happy to assist you.

## 2012-04-06 ENCOUNTER — Inpatient Hospital Stay (HOSPITAL_COMMUNITY): Admission: RE | Admit: 2012-04-06 | Payer: Medicare Other | Source: Ambulatory Visit

## 2012-04-06 ENCOUNTER — Encounter (HOSPITAL_COMMUNITY)
Admission: RE | Admit: 2012-04-06 | Discharge: 2012-04-06 | Disposition: A | Discharge status: Home / Self Care | Payer: Medicare Other | Source: Ambulatory Visit | Attending: Ophthalmology | Admitting: Ophthalmology

## 2012-04-06 ENCOUNTER — Encounter (HOSPITAL_COMMUNITY): Payer: Self-pay

## 2012-04-06 HISTORY — DX: Unspecified osteoarthritis, unspecified site: M19.90

## 2012-04-06 HISTORY — DX: Malignant (primary) neoplasm, unspecified: C80.1

## 2012-04-06 LAB — BASIC METABOLIC PANEL
BUN: 13 mg/dL (ref 6–23)
Chloride: 105 mEq/L (ref 96–112)
GFR calc Af Amer: >90 mL/min (ref >90)
Potassium: 4.4 mEq/L (ref 3.5–5.1)

## 2012-04-06 LAB — HEMOGLOBIN AND HEMATOCRIT, BLOOD: Hemoglobin: 12.6 g/dL (ref 12.0–15.0)

## 2012-04-10 ENCOUNTER — Encounter (HOSPITAL_COMMUNITY): Payer: Self-pay | Admitting: *Deleted

## 2012-04-10 ENCOUNTER — Encounter (HOSPITAL_COMMUNITY)
Admission: RE | Disposition: A | Discharge status: Home / Self Care | Payer: Self-pay | Source: Ambulatory Visit | Attending: Ophthalmology

## 2012-04-10 ENCOUNTER — Ambulatory Visit (HOSPITAL_COMMUNITY)
Admission: RE | Admit: 2012-04-10 | Discharge: 2012-04-10 | Disposition: A | Discharge status: Home / Self Care | Payer: Medicare Other | Source: Ambulatory Visit | Attending: Ophthalmology | Admitting: Ophthalmology

## 2012-04-10 ENCOUNTER — Ambulatory Visit (HOSPITAL_COMMUNITY): Payer: Medicare Other | Admitting: Anesthesiology

## 2012-04-10 ENCOUNTER — Encounter (HOSPITAL_COMMUNITY): Payer: Self-pay | Admitting: Anesthesiology

## 2012-04-10 ENCOUNTER — Encounter (HOSPITAL_COMMUNITY): Payer: Self-pay | Admitting: Ophthalmology

## 2012-04-10 DIAGNOSIS — Z01810 Encounter for preprocedural cardiovascular examination: Secondary | ICD-10-CM | POA: Insufficient documentation

## 2012-04-10 DIAGNOSIS — Z01812 Encounter for preprocedural laboratory examination: Secondary | ICD-10-CM | POA: Insufficient documentation

## 2012-04-10 DIAGNOSIS — H2510 Age-related nuclear cataract, unspecified eye: Secondary | ICD-10-CM | POA: Insufficient documentation

## 2012-04-10 HISTORY — PX: CATARACT EXTRACTION W/PHACO: SHX586

## 2012-04-10 SURGERY — PHACOEMULSIFICATION, CATARACT, WITH IOL INSERTION
Anesthesia: Monitor Anesthesia Care | Site: Eye | Laterality: Left | Wound class: Clean

## 2012-04-10 MED ORDER — LIDOCAINE 3.5 % OP GEL OPTIME - NO CHARGE
OPHTHALMIC | Status: DC | PRN
  Administered 2012-04-10: 2 [drp] via OPHTHALMIC

## 2012-04-10 MED ORDER — MIDAZOLAM HCL 2 MG/2ML IJ SOLN
INTRAMUSCULAR | Status: AC
  Filled 2012-04-10: qty 2

## 2012-04-10 MED ORDER — LIDOCAINE HCL 3.5 % OP GEL
1.0000 "application " | Freq: Once | OPHTHALMIC | Status: AC
  Administered 2012-04-10: 1 via OPHTHALMIC

## 2012-04-10 MED ORDER — BSS IO SOLN
INTRAOCULAR | Status: DC | PRN
  Administered 2012-04-10: 15 mL via INTRAOCULAR

## 2012-04-10 MED ORDER — POVIDONE-IODINE 5 % OP SOLN
OPHTHALMIC | Status: DC | PRN
  Administered 2012-04-10: 1 via OPHTHALMIC

## 2012-04-10 MED ORDER — PHENYLEPHRINE HCL 2.5 % OP SOLN
OPHTHALMIC | Status: AC
  Filled 2012-04-10: qty 2

## 2012-04-10 MED ORDER — LACTATED RINGERS IV SOLN
INTRAVENOUS | Status: DC
  Administered 2012-04-10: 13:00:00 via INTRAVENOUS

## 2012-04-10 MED ORDER — LIDOCAINE HCL (PF) 1 % IJ SOLN
INTRAMUSCULAR | Status: AC
  Filled 2012-04-10: qty 2

## 2012-04-10 MED ORDER — LIDOCAINE HCL 3.5 % OP GEL
OPHTHALMIC | Status: AC
  Filled 2012-04-10: qty 5

## 2012-04-10 MED ORDER — EPINEPHRINE HCL 1 MG/ML IJ SOLN
INTRAOCULAR | Status: DC | PRN
  Administered 2012-04-10: 13:00:00

## 2012-04-10 MED ORDER — EPINEPHRINE HCL 1 MG/ML IJ SOLN
INTRAMUSCULAR | Status: AC
  Filled 2012-04-10: qty 1

## 2012-04-10 MED ORDER — PROVISC 10 MG/ML IO SOLN
INTRAOCULAR | Status: DC | PRN
  Administered 2012-04-10: 8.5 mg via INTRAOCULAR

## 2012-04-10 MED ORDER — NEOMYCIN-POLYMYXIN-DEXAMETH 3.5-10000-0.1 OP OINT
TOPICAL_OINTMENT | OPHTHALMIC | Status: AC
  Filled 2012-04-10: qty 3.5

## 2012-04-10 MED ORDER — PHENYLEPHRINE HCL 2.5 % OP SOLN
1.0000 [drp] | OPHTHALMIC | Status: AC
  Administered 2012-04-10 (×3): 1 [drp] via OPHTHALMIC

## 2012-04-10 MED ORDER — CYCLOPENTOLATE-PHENYLEPHRINE 0.2-1 % OP SOLN
1.0000 [drp] | OPHTHALMIC | Status: AC
  Administered 2012-04-10 (×3): 1 [drp] via OPHTHALMIC

## 2012-04-10 MED ORDER — CYCLOPENTOLATE-PHENYLEPHRINE 0.2-1 % OP SOLN
OPHTHALMIC | Status: AC
  Filled 2012-04-10: qty 2

## 2012-04-10 MED ORDER — TETRACAINE HCL 0.5 % OP SOLN
OPHTHALMIC | Status: AC
  Filled 2012-04-10: qty 2

## 2012-04-10 MED ORDER — LIDOCAINE HCL (PF) 1 % IJ SOLN
INTRAMUSCULAR | Status: DC | PRN
  Administered 2012-04-10: .3 mL

## 2012-04-10 MED ORDER — MIDAZOLAM HCL 2 MG/2ML IJ SOLN
1.0000 mg | INTRAMUSCULAR | Status: DC | PRN
  Administered 2012-04-10: 2 mg via INTRAVENOUS

## 2012-04-10 MED ORDER — TETRACAINE HCL 0.5 % OP SOLN
1.0000 [drp] | OPHTHALMIC | Status: AC
  Administered 2012-04-10 (×3): 1 [drp] via OPHTHALMIC

## 2012-04-10 SURGICAL SUPPLY — 33 items
CAPSULAR TENSION RING-AMO (OPHTHALMIC RELATED) IMPLANT
CLOTH BEACON ORANGE TIMEOUT ST (SAFETY) ×1 IMPLANT
EYE SHIELD UNIVERSAL CLEAR (GAUZE/BANDAGES/DRESSINGS) ×1 IMPLANT
GLOVE BIO SURGEON STRL SZ 6.5 (GLOVE) IMPLANT
GLOVE BIOGEL PI IND STRL 6.5 (GLOVE) IMPLANT
GLOVE BIOGEL PI IND STRL 7.0 (GLOVE) IMPLANT
GLOVE BIOGEL PI IND STRL 7.5 (GLOVE) IMPLANT
GLOVE BIOGEL PI INDICATOR 6.5 (GLOVE) ×1
GLOVE BIOGEL PI INDICATOR 7.0 (GLOVE)
GLOVE BIOGEL PI INDICATOR 7.5 (GLOVE)
GLOVE ECLIPSE 6.5 STRL STRAW (GLOVE) IMPLANT
GLOVE ECLIPSE 7.0 STRL STRAW (GLOVE) IMPLANT
GLOVE ECLIPSE 7.5 STRL STRAW (GLOVE) IMPLANT
GLOVE EXAM NITRILE LRG STRL (GLOVE) IMPLANT
GLOVE EXAM NITRILE MD LF STRL (GLOVE) ×1 IMPLANT
GLOVE SKINSENSE NS SZ6.5 (GLOVE)
GLOVE SKINSENSE NS SZ7.0 (GLOVE)
GLOVE SKINSENSE STRL SZ6.5 (GLOVE) IMPLANT
GLOVE SKINSENSE STRL SZ7.0 (GLOVE) IMPLANT
KIT VITRECTOMY (OPHTHALMIC RELATED) IMPLANT
PAD ARMBOARD 7.5X6 YLW CONV (MISCELLANEOUS) ×1 IMPLANT
PROC W NO LENS (INTRAOCULAR LENS)
PROC W SPEC LENS (INTRAOCULAR LENS)
PROCESS W NO LENS (INTRAOCULAR LENS) IMPLANT
PROCESS W SPEC LENS (INTRAOCULAR LENS) IMPLANT
RING MALYGIN (MISCELLANEOUS) IMPLANT
SIGHTPATH CAT PROC W REG LENS (Ophthalmic Related) ×2 IMPLANT
SYR TB 1ML LL NO SAFETY (SYRINGE) ×1 IMPLANT
TAPE SURG TRANSPORE 1 IN (GAUZE/BANDAGES/DRESSINGS) IMPLANT
TAPE SURGICAL TRANSPORE 1 IN (GAUZE/BANDAGES/DRESSINGS) ×1
VISCOELASTIC ADDITIONAL (OPHTHALMIC RELATED) IMPLANT
WATER STERILE IRR 1000ML POUR (IV SOLUTION) ×1 IMPLANT
WATER STERILE IRR 250ML POUR (IV SOLUTION) IMPLANT

## 2012-04-10 NOTE — H&P (Signed)
I have reviewed the H&P, the patient was re-examined, and I have identified no interval changes in medical condition and plan of care since the history and physical of record  

## 2012-04-10 NOTE — Brief Op Note (Signed)
Pre-Op Dx: Cataract OS Post-Op Dx: Cataract OS Surgeon: Evarose Altland Anesthesia: Topical with MAC Surgery: Cataract Extraction with Intraocular lens Implant OS Implant: B&L enVista Specimen: None Complications: None 

## 2012-04-10 NOTE — Transfer of Care (Signed)
Immediate Anesthesia Transfer of Care Note  Patient: Tami Love  Procedure(s) Performed: Procedure(s) (LRB) with comments: CATARACT EXTRACTION PHACO AND INTRAOCULAR LENS PLACEMENT (IOC) (Left) - CDE 17.03  Patient Location: PACU and Short Stay  Anesthesia Type:MAC  Level of Consciousness: awake  Airway & Oxygen Therapy: Patient Spontanous Breathing  Post-op Assessment: Report given to PACU RN  Post vital signs: Reviewed  Complications: No apparent anesthesia complications

## 2012-04-10 NOTE — Anesthesia Preprocedure Evaluation (Signed)
Anesthesia Evaluation  Patient identified by MRN, date of birth, ID band Patient awake    Reviewed: Allergy & Precautions, H&P , NPO status , Patient's Chart, lab work & pertinent test results  History of Anesthesia Complications Negative for: history of anesthetic complications  Airway Mallampati: II      Dental  (+) Teeth Intact   Pulmonary neg pulmonary ROS,  breath sounds clear to auscultation        Cardiovascular negative cardio ROS  Rhythm:Regular     Neuro/Psych    GI/Hepatic GERD-  Medicated and Controlled,  Endo/Other    Renal/GU      Musculoskeletal   Abdominal   Peds  Hematology   Anesthesia Other Findings   Reproductive/Obstetrics                           Anesthesia Physical Anesthesia Plan  ASA: II  Anesthesia Plan: MAC   Post-op Pain Management:    Induction: Intravenous  Airway Management Planned: Nasal Cannula  Additional Equipment:   Intra-op Plan:   Post-operative Plan:   Informed Consent: I have reviewed the patients History and Physical, chart, labs and discussed the procedure including the risks, benefits and alternatives for the proposed anesthesia with the patient or authorized representative who has indicated his/her understanding and acceptance.     Plan Discussed with:   Anesthesia Plan Comments:         Anesthesia Quick Evaluation

## 2012-04-10 NOTE — Anesthesia Postprocedure Evaluation (Signed)
  Anesthesia Post-op Note  Patient: Tami Love  Procedure(s) Performed: Procedure(s) (LRB) with comments: CATARACT EXTRACTION PHACO AND INTRAOCULAR LENS PLACEMENT (IOC) (Left) - CDE 17.03  Patient Location: PACU and Short Stay  Anesthesia Type:MAC  Level of Consciousness: awake  Airway and Oxygen Therapy: Patient Spontanous Breathing  Post-op Pain: none  Post-op Assessment: Post-op Vital signs reviewed, Patient's Cardiovascular Status Stable, Respiratory Function Stable, Patent Airway and No signs of Nausea or vomiting  Post-op Vital Signs: Reviewed and stable  Complications: No apparent anesthesia complications

## 2012-04-11 NOTE — Op Note (Signed)
NAMEPAM, FINNIGAN NO.:  [id number]  MEDICAL RECORD NO.:  [id number]  LOCATION:  [facility]                          FACILITY:  [facility]  PHYSICIAN:  Susanne Greenhouse, MD       DATE OF BIRTH:  04-15-1946  DATE OF PROCEDURE:  04/10/2012 DATE OF DISCHARGE:  04/10/2012                              OPERATIVE REPORT   PREOPERATIVE DIAGNOSIS:  Nuclear cataract, left eye, diagnosis code 366.16.  POSTOPERATIVE DIAGNOSIS:  Nuclear cataract, left eye, diagnosis code 366.16.  SURGEON:  Susanne Greenhouse, MD  OPERATION PERFORMED:  Phacoemulsification with posterior chamber intraocular lens implantation, left eye.  ANESTHESIA:  Topical with monitored anesthesia care and IV sedation.  OPERATIVE SUMMARY:  In the preoperative area, dilating drops were placed into the left eye.  The patient was then brought into the operating room where she was placed under topical anesthesia and IV sedation.  The eye was then prepped and draped.  Beginning with a 75 blade, a paracentesis port was made at the surgeon's 2 o'clock position.  The anterior chamber was then filled with a 1% nonpreserved lidocaine solution with epinephrine.  This was followed by Viscoat to deepen the chamber.  A small fornix-based peritomy was performed superiorly.  Next, a single iris hook was placed through the limbus superiorly.  A 2.4-mm keratome blade was then used to make a clear corneal incision over the iris hook. A bent cystotome needle and Utrata forceps were used to create a continuous tear capsulotomy.  Hydrodissection was performed using balanced salt solution on a fine cannula.  The lens nucleus was then removed using phacoemulsification in a quadrant cracking technique.  The cortical material was then removed with irrigation and aspiration.  The capsular bag and anterior chamber were refilled with Provisc.  The wound was widened to approximately 3 mm and a posterior chamber intraocular lens was placed into the  capsular bag without difficulty using an [employer] lens injecting system.  A single 10-0 nylon suture was then used to close the incision as well as stromal hydration.  The Provisc was removed from the anterior chamber and capsular bag with irrigation and aspiration.  At this point, the wounds were tested for leak, which were negative.  The anterior chamber remained deep and stable.  The patient tolerated the procedure well.  There were no operative complications, and she awoke from topical anesthesia and IV sedation without problem.  No surgical specimens.  Prosthetic device used is a Theme park manager, model EnVista, model number MX60, power of 17.0, serial number is [id number].          ______________________________ Susanne Greenhouse, MD     KEH/MEDQ  D:  04/10/2012  T:  04/11/2012  Job:  [id number]

## 2012-04-12 ENCOUNTER — Encounter (HOSPITAL_COMMUNITY): Payer: Self-pay | Admitting: Ophthalmology

## 2012-04-26 ENCOUNTER — Other Ambulatory Visit: Payer: Self-pay | Admitting: Adult Health

## 2012-04-26 ENCOUNTER — Other Ambulatory Visit (HOSPITAL_COMMUNITY)
Admission: RE | Admit: 2012-04-26 | Discharge: 2012-04-26 | Disposition: A | Discharge status: Home / Self Care | Payer: Medicare Other | Source: Ambulatory Visit | Attending: Obstetrics and Gynecology | Admitting: Obstetrics and Gynecology

## 2012-04-26 DIAGNOSIS — Z01419 Encounter for gynecological examination (general) (routine) without abnormal findings: Secondary | ICD-10-CM | POA: Insufficient documentation

## 2012-04-26 DIAGNOSIS — Z1151 Encounter for screening for human papillomavirus (HPV): Secondary | ICD-10-CM | POA: Insufficient documentation

## 2012-06-19 ENCOUNTER — Other Ambulatory Visit (HOSPITAL_COMMUNITY): Payer: Self-pay | Admitting: Internal Medicine

## 2012-06-19 DIAGNOSIS — N6459 Other signs and symptoms in breast: Secondary | ICD-10-CM

## 2012-07-19 ENCOUNTER — Ambulatory Visit (HOSPITAL_COMMUNITY)
Admission: RE | Admit: 2012-07-19 | Discharge: 2012-07-19 | Disposition: A | Discharge status: Home / Self Care | Payer: Medicare Other | Source: Ambulatory Visit | Attending: Internal Medicine | Admitting: Internal Medicine

## 2012-07-19 DIAGNOSIS — N6459 Other signs and symptoms in breast: Secondary | ICD-10-CM | POA: Insufficient documentation

## 2012-07-19 DIAGNOSIS — N644 Mastodynia: Secondary | ICD-10-CM | POA: Insufficient documentation

## 2013-05-03 ENCOUNTER — Encounter: Payer: Self-pay | Admitting: Adult Health

## 2013-05-03 ENCOUNTER — Ambulatory Visit (INDEPENDENT_AMBULATORY_CARE_PROVIDER_SITE_OTHER): Payer: Medicare Other | Admitting: Adult Health

## 2013-05-03 VITALS — BP 118/80 | HR 72 | Ht 66.0 in | Wt 180.0 lb

## 2013-05-03 DIAGNOSIS — Z01419 Encounter for gynecological examination (general) (routine) without abnormal findings: Secondary | ICD-10-CM

## 2013-05-03 DIAGNOSIS — Z1212 Encounter for screening for malignant neoplasm of rectum: Secondary | ICD-10-CM

## 2013-05-03 DIAGNOSIS — Z8543 Personal history of malignant neoplasm of ovary: Secondary | ICD-10-CM

## 2013-05-03 LAB — HEMOCCULT GUIAC POC 1CARD (OFFICE)

## 2013-05-03 NOTE — Patient Instructions (Signed)
Physical in 1 year Mammogram yearly colonoscopy as per GI

## 2013-05-03 NOTE — Progress Notes (Signed)
Patient ID: Tami Love, female   DOB: 04-04-1946, 67 y.o.   MRN: [id number] History of Present Illness: Tami Love is a 67 year old white female married in for gyn physical.She had a normal pap with negative HPV in 2013.She got flu shot this year.   Current Medications, Allergies, Past Medical History, Past Surgical History, Family History and Social History were reviewed in [employer] record.   Past Medical History  Diagnosis Date  . Hyperlipidemia   . GERD (gastroesophageal reflux disease)   . Angina     remote history  . Colon polyps   . Cancer 2000    ovarian cancer  . Arthritis    Past Surgical History  Procedure Laterality Date  . Total hip arthroplasty  2010, 2007    bilateral  . Colonoscopy  04/29/2011    Procedure: COLONOSCOPY;  Surgeon: Malissa Hippo, MD;  Location: [facility];  Service: Endoscopy;  Laterality: N/A;  9:30  . Abdominal hysterectomy  2000    ovarian  . Eye surgery  2011    [facility]-right cataract  . Cataract extraction w/phaco  04/10/2012    Procedure: CATARACT EXTRACTION PHACO AND INTRAOCULAR LENS PLACEMENT (IOC);  Surgeon: Gemma Payor, MD;  Location: [facility];  Service: Ophthalmology;  Laterality: Left;  CDE 17.03  . Total abdominal hysterectomy w/ bilateral salpingoophorectomy  11/24/1998  Current outpatient prescriptions:acetaminophen (TYLENOL) 500 MG tablet, Take 1,000 mg by mouth every 6 (six) hours as needed. Arthritis pain , Disp: , Rfl: ;  citalopram (CELEXA) 20 MG tablet, Take 20 mg by mouth daily.  , Disp: , Rfl: ;  ibuprofen (ADVIL,MOTRIN) 200 MG tablet, Take 400 mg by mouth every 8 (eight) hours as needed. Arthritis pain , Disp: , Rfl: ;  omeprazole (PRILOSEC) 20 MG capsule, Take 20 mg by mouth daily.  , Disp: , Rfl:  simvastatin (ZOCOR) 40 MG tablet, Take 40 mg by mouth at bedtime.  , Disp: , Rfl:   Review of Systems: Patient denies any headaches, blurred vision, shortness of breath, chest pain, abdominal pain,  problems with bowel movements, urination, or intercourse.No joint pain but had a pop in right rib last week and pain is better now but has had a cold this week.Moods good, has app next week with Dr Ouida Sills.Just had labs done for him.She declines xray of ribs today but will mention next week to Dr Ouida Sills if pain still there.     Physical Exam:BP 118/80  Pulse 72  Ht 5\' 6"  (1.676 m)  Wt 180 lb (81.647 kg)  BMI 29.07 kg/m2 General:  Well developed, well nourished, no acute distress Skin:  Warm and dry Neck:  Midline trachea, normal thyroid Lungs; Clear to auscultation bilaterally Breast:  No dominant palpable mass, retraction, or nipple discharge, has slight tenderness right rib edge. Cardiovascular: Regular rate and rhythm Abdomen:  Soft, non tender, no hepatosplenomegaly Pelvic:  External genitalia is normal in appearance.  The vagina is normal in appearance for age.               The cervix and uterus are absent. No   adnexal masses or tenderness noted. Rectal: Good sphincter tone, no polyps, or hemorrhoids felt.  Hemoccult negative. Extremities:  No swelling  noted Psych:  No mood changes, alert and cooperative, seems happy   Impression: Yearly gyn exam no pap History of ovarian cancer sp [facility]BSO    Plan: Physical in 1 year Mammogram yearly due in February Colonoscopy per GI Labs  with PCP Call prn problems

## 2013-07-02 ENCOUNTER — Other Ambulatory Visit (HOSPITAL_COMMUNITY): Payer: Self-pay | Admitting: Internal Medicine

## 2013-07-02 DIAGNOSIS — Z139 Encounter for screening, unspecified: Secondary | ICD-10-CM

## 2013-07-23 ENCOUNTER — Other Ambulatory Visit (HOSPITAL_COMMUNITY): Payer: Self-pay | Admitting: Internal Medicine

## 2013-07-23 ENCOUNTER — Ambulatory Visit (HOSPITAL_COMMUNITY)
Admission: RE | Admit: 2013-07-23 | Discharge: 2013-07-23 | Disposition: A | Discharge status: Home / Self Care | Payer: Medicare HMO | Source: Ambulatory Visit | Attending: Internal Medicine | Admitting: Internal Medicine

## 2013-07-23 DIAGNOSIS — Z1231 Encounter for screening mammogram for malignant neoplasm of breast: Secondary | ICD-10-CM | POA: Insufficient documentation

## 2014-04-01 ENCOUNTER — Encounter: Payer: Self-pay | Admitting: Adult Health

## 2014-05-08 ENCOUNTER — Encounter: Payer: Self-pay | Admitting: Adult Health

## 2014-05-08 ENCOUNTER — Ambulatory Visit (INDEPENDENT_AMBULATORY_CARE_PROVIDER_SITE_OTHER): Payer: Medicare HMO | Admitting: Adult Health

## 2014-05-08 VITALS — BP 122/90 | HR 74 | Ht 66.0 in | Wt 191.5 lb

## 2014-05-08 DIAGNOSIS — Z8543 Personal history of malignant neoplasm of ovary: Secondary | ICD-10-CM | POA: Diagnosis not present

## 2014-05-08 DIAGNOSIS — R52 Pain, unspecified: Secondary | ICD-10-CM

## 2014-05-08 DIAGNOSIS — Z1212 Encounter for screening for malignant neoplasm of rectum: Secondary | ICD-10-CM

## 2014-05-08 DIAGNOSIS — Z01419 Encounter for gynecological examination (general) (routine) without abnormal findings: Secondary | ICD-10-CM

## 2014-05-08 DIAGNOSIS — Z0000 Encounter for general adult medical examination without abnormal findings: Secondary | ICD-10-CM | POA: Diagnosis not present

## 2014-05-08 HISTORY — DX: Pain, unspecified: R52

## 2014-05-08 LAB — HEMOCCULT GUIAC POC 1CARD (OFFICE): Fecal Occult Blood, POC: NEGATIVE

## 2014-05-08 NOTE — Patient Instructions (Signed)
Physical in  2 years Mammogram yearly Colonoscopy per GI  Labs with PCP

## 2014-05-08 NOTE — Progress Notes (Signed)
Patient ID: AERICA Love, female   DOB: 01/31/1946, 68 y.o.   MRN: [id number] History of Present Illness: Tami Love is a 68 year old white female, married in for gyn exam.She had normal pap with negative HPV 04/26/12.Got labs to day for Dr Tami Love and got flu shot in October.   Current Medications, Allergies, Past Medical History, Past Surgical History, Family History and Social History were reviewed in [employer] record.     Review of Systems: Patient denies any headaches, blurred vision, shortness of breath, chest pain, abdominal pain, problems with bowel movements, urination, or intercourse. Not having sex, has aches and pains in back, hips and knees and lower legs red and tender,she saw dermatologists in past.Moods OK but not as motivated at times.    Physical Exam:BP 122/90 mmHg  Pulse 74  Ht 5\' 6"  (1.676 m)  Wt 191 lb 8 oz (86.864 kg)  BMI 30.92 kg/m2 General:  Well developed, well nourished, no acute distress Skin:  Warm and dry Neck:  Midline trachea, normal thyroid, no carotid bruits Lungs; Clear to auscultation bilaterally Breast:  No dominant palpable mass, retraction, or nipple discharge Cardiovascular: Regular rate and rhythm Abdomen:  Soft, non tender, no hepatosplenomegaly Pelvic:  External genitalia is normal in appearance, for age with out lesions.  The vagina is atrophic.The cervix and uterus are absent.  No   adnexal masses or tenderness noted. Rectal: Good sphincter tone, no polyps,small interanl hemorrhoids felt.  Hemoccult negative. Extremities:  No swelling noted, has spider veins and good bilateral pulses with some red discoloration across lower legs and knees and shins tender to touch Psych:  No mood changes,alert and cooperative, seems happy Discussed trying support hose and using rolling walk when going to mail box, talk with Dr Tami Love, next week at her appt,    Impression: Well woman gyn exam no pap History of ovarian cancer Body  aches    Plan: Physical  in 2 years Mammogram yearly  Labs with Dr Tami Love Colonoscopy per GI

## 2014-09-09 ENCOUNTER — Other Ambulatory Visit (HOSPITAL_COMMUNITY): Payer: Self-pay | Admitting: Internal Medicine

## 2014-09-09 DIAGNOSIS — Z1231 Encounter for screening mammogram for malignant neoplasm of breast: Secondary | ICD-10-CM

## 2014-09-18 ENCOUNTER — Ambulatory Visit (HOSPITAL_COMMUNITY)
Admission: RE | Admit: 2014-09-18 | Discharge: 2014-09-18 | Disposition: A | Discharge status: Home / Self Care | Payer: Medicare HMO | Source: Ambulatory Visit | Attending: Internal Medicine | Admitting: Internal Medicine

## 2014-09-18 DIAGNOSIS — Z1231 Encounter for screening mammogram for malignant neoplasm of breast: Secondary | ICD-10-CM | POA: Diagnosis present

## 2014-09-19 ENCOUNTER — Ambulatory Visit (HOSPITAL_COMMUNITY): Payer: Medicare Other

## 2015-05-07 DIAGNOSIS — L723 Sebaceous cyst: Secondary | ICD-10-CM | POA: Diagnosis not present

## 2015-05-07 DIAGNOSIS — X32XXXD Exposure to sunlight, subsequent encounter: Secondary | ICD-10-CM | POA: Diagnosis not present

## 2015-05-07 DIAGNOSIS — L570 Actinic keratosis: Secondary | ICD-10-CM | POA: Diagnosis not present

## 2015-05-07 DIAGNOSIS — L820 Inflamed seborrheic keratosis: Secondary | ICD-10-CM | POA: Diagnosis not present

## 2015-05-15 DIAGNOSIS — Z79899 Other long term (current) drug therapy: Secondary | ICD-10-CM | POA: Diagnosis not present

## 2015-05-15 DIAGNOSIS — M1990 Unspecified osteoarthritis, unspecified site: Secondary | ICD-10-CM | POA: Diagnosis not present

## 2015-05-15 DIAGNOSIS — E785 Hyperlipidemia, unspecified: Secondary | ICD-10-CM | POA: Diagnosis not present

## 2015-05-23 DIAGNOSIS — Z23 Encounter for immunization: Secondary | ICD-10-CM | POA: Diagnosis not present

## 2015-05-23 DIAGNOSIS — R69 Illness, unspecified: Secondary | ICD-10-CM | POA: Diagnosis not present

## 2015-05-23 DIAGNOSIS — Z6830 Body mass index (BMI) 30.0-30.9, adult: Secondary | ICD-10-CM | POA: Diagnosis not present

## 2015-05-23 DIAGNOSIS — Z0001 Encounter for general adult medical examination with abnormal findings: Secondary | ICD-10-CM | POA: Diagnosis not present

## 2015-05-23 DIAGNOSIS — E785 Hyperlipidemia, unspecified: Secondary | ICD-10-CM | POA: Diagnosis not present

## 2015-08-11 ENCOUNTER — Other Ambulatory Visit (HOSPITAL_COMMUNITY): Payer: Self-pay | Admitting: Internal Medicine

## 2015-08-11 DIAGNOSIS — Z1231 Encounter for screening mammogram for malignant neoplasm of breast: Secondary | ICD-10-CM

## 2015-09-19 ENCOUNTER — Ambulatory Visit (HOSPITAL_COMMUNITY): Payer: Medicare HMO

## 2015-09-22 ENCOUNTER — Ambulatory Visit (HOSPITAL_COMMUNITY)
Admission: RE | Admit: 2015-09-22 | Discharge: 2015-09-22 | Disposition: A | Discharge status: Home / Self Care | Payer: Medicare HMO | Source: Ambulatory Visit | Attending: Internal Medicine | Admitting: Internal Medicine

## 2015-09-22 DIAGNOSIS — Z1231 Encounter for screening mammogram for malignant neoplasm of breast: Secondary | ICD-10-CM | POA: Diagnosis not present

## 2015-10-14 DIAGNOSIS — L570 Actinic keratosis: Secondary | ICD-10-CM | POA: Diagnosis not present

## 2015-10-14 DIAGNOSIS — X32XXXD Exposure to sunlight, subsequent encounter: Secondary | ICD-10-CM | POA: Diagnosis not present

## 2015-10-14 DIAGNOSIS — D225 Melanocytic nevi of trunk: Secondary | ICD-10-CM | POA: Diagnosis not present

## 2015-10-14 DIAGNOSIS — Z1283 Encounter for screening for malignant neoplasm of skin: Secondary | ICD-10-CM | POA: Diagnosis not present

## 2016-01-26 DIAGNOSIS — H5210 Myopia, unspecified eye: Secondary | ICD-10-CM | POA: Diagnosis not present

## 2016-01-31 DIAGNOSIS — Z0100 Encounter for examination of eyes and vision without abnormal findings: Secondary | ICD-10-CM | POA: Diagnosis not present

## 2016-03-01 DIAGNOSIS — Z23 Encounter for immunization: Secondary | ICD-10-CM | POA: Diagnosis not present

## 2016-03-01 DIAGNOSIS — I872 Venous insufficiency (chronic) (peripheral): Secondary | ICD-10-CM | POA: Diagnosis not present

## 2016-05-11 ENCOUNTER — Encounter (INDEPENDENT_AMBULATORY_CARE_PROVIDER_SITE_OTHER): Payer: Self-pay

## 2016-05-11 ENCOUNTER — Ambulatory Visit (INDEPENDENT_AMBULATORY_CARE_PROVIDER_SITE_OTHER): Payer: Medicare HMO | Admitting: Adult Health

## 2016-05-11 ENCOUNTER — Encounter: Payer: Self-pay | Admitting: Adult Health

## 2016-05-11 VITALS — BP 110/78 | HR 76 | Ht 66.25 in | Wt 187.0 lb

## 2016-05-11 DIAGNOSIS — Z01419 Encounter for gynecological examination (general) (routine) without abnormal findings: Secondary | ICD-10-CM | POA: Diagnosis not present

## 2016-05-11 DIAGNOSIS — Z1211 Encounter for screening for malignant neoplasm of colon: Secondary | ICD-10-CM

## 2016-05-11 DIAGNOSIS — Z8543 Personal history of malignant neoplasm of ovary: Secondary | ICD-10-CM

## 2016-05-11 LAB — HEMOCCULT GUIAC POC 1CARD (OFFICE): FECAL OCCULT BLD: NEGATIVE

## 2016-05-11 NOTE — Patient Instructions (Signed)
Physical in 2 years Mammogram yearly Colonoscopy pr Dr Laural Golden Labs with Dr Willey Blade

## 2016-05-11 NOTE — Progress Notes (Signed)
Patient ID: Tami Love, female   DOB: [birth year], 70 y.o.   MRN: [id number] History of Present Illness: Tami Love is a 70 year old white female in for a well woman gyn exam, she is sp hysterectomy, had ovarian cancer. PCP is Dr Willey Blade, and she had labs and flu shot with him.   Current Medications, Allergies, Past Medical History, Past Surgical History, Family History and Social History were reviewed in [employer] record.     Review of Systems: Patient denies any headaches, hearing loss, fatigue, blurred vision, shortness of breath, chest pain, abdominal pain, problems with bowel movements(some IBS, may have diarrhea or constipation), urination, or intercourse. No joint pain or mood swings.Has had red painless rectal bleeding with big BM.   Physical Exam: General:  Well developed, well nourished, no acute distress Skin:  Warm and dry Neck:  Midline trachea, normal thyroid, good ROM, no lymphadenopathy,nop carotid bruits heard Lungs; Clear to auscultation bilaterally Breast:  No dominant palpable mass, retraction, or nipple discharge Cardiovascular: Regular rate and rhythm Abdomen:  Soft, non tender, no hepatosplenomegaly Pelvic:  External genitalia is normal in appearance, no lesions.  The vagina is normal in appearance. Urethra has no lesions or masses. The cervix and uterus are absent. No adnexal masses or tenderness noted.Bladder is non tender, no masses felt. Rectal: Good sphincter tone, no polyps, + hemorrhoids felt.  Hemoccult negative. Extremities/musculoskeletal:  No swelling, has AKs and spider veins, no clubbing or cyanosis Psych:  No mood changes, alert and cooperative,seems happy PHQ 2 score 1.  Impression: 1. Well woman exam with routine gynecological exam   2. History of ovarian cancer       Plan: Physical in 2 years Mammogram yearly Colonoscopy pr Dr Laural Golden Labs with Dr Willey Blade  Try cortisone cream to hemorrhoid and lubricate with vaseline if  thinks Bm will be hard or big

## 2016-06-07 DIAGNOSIS — M1990 Unspecified osteoarthritis, unspecified site: Secondary | ICD-10-CM | POA: Diagnosis not present

## 2016-06-07 DIAGNOSIS — E785 Hyperlipidemia, unspecified: Secondary | ICD-10-CM | POA: Diagnosis not present

## 2016-06-07 DIAGNOSIS — Z1159 Encounter for screening for other viral diseases: Secondary | ICD-10-CM | POA: Diagnosis not present

## 2016-06-07 DIAGNOSIS — Z79899 Other long term (current) drug therapy: Secondary | ICD-10-CM | POA: Diagnosis not present

## 2016-06-15 DIAGNOSIS — Z0001 Encounter for general adult medical examination with abnormal findings: Secondary | ICD-10-CM | POA: Diagnosis not present

## 2016-06-15 DIAGNOSIS — M1990 Unspecified osteoarthritis, unspecified site: Secondary | ICD-10-CM | POA: Diagnosis not present

## 2016-06-15 DIAGNOSIS — Z6830 Body mass index (BMI) 30.0-30.9, adult: Secondary | ICD-10-CM | POA: Diagnosis not present

## 2016-06-15 DIAGNOSIS — Z23 Encounter for immunization: Secondary | ICD-10-CM | POA: Diagnosis not present

## 2016-06-15 DIAGNOSIS — E785 Hyperlipidemia, unspecified: Secondary | ICD-10-CM | POA: Diagnosis not present

## 2016-06-25 DIAGNOSIS — H1031 Unspecified acute conjunctivitis, right eye: Secondary | ICD-10-CM | POA: Diagnosis not present

## 2016-06-29 DIAGNOSIS — L570 Actinic keratosis: Secondary | ICD-10-CM | POA: Diagnosis not present

## 2016-06-29 DIAGNOSIS — D0472 Carcinoma in situ of skin of left lower limb, including hip: Secondary | ICD-10-CM | POA: Diagnosis not present

## 2016-06-29 DIAGNOSIS — X32XXXD Exposure to sunlight, subsequent encounter: Secondary | ICD-10-CM | POA: Diagnosis not present

## 2016-07-01 ENCOUNTER — Encounter (INDEPENDENT_AMBULATORY_CARE_PROVIDER_SITE_OTHER): Payer: Self-pay | Admitting: Internal Medicine

## 2016-07-15 ENCOUNTER — Encounter (INDEPENDENT_AMBULATORY_CARE_PROVIDER_SITE_OTHER): Payer: Self-pay | Admitting: Internal Medicine

## 2016-07-15 ENCOUNTER — Encounter (INDEPENDENT_AMBULATORY_CARE_PROVIDER_SITE_OTHER): Payer: Self-pay

## 2016-07-15 ENCOUNTER — Other Ambulatory Visit (INDEPENDENT_AMBULATORY_CARE_PROVIDER_SITE_OTHER): Payer: Self-pay | Admitting: Internal Medicine

## 2016-07-15 ENCOUNTER — Ambulatory Visit (INDEPENDENT_AMBULATORY_CARE_PROVIDER_SITE_OTHER): Payer: Medicare HMO | Admitting: Internal Medicine

## 2016-07-15 ENCOUNTER — Encounter (INDEPENDENT_AMBULATORY_CARE_PROVIDER_SITE_OTHER): Payer: Self-pay | Admitting: *Deleted

## 2016-07-15 VITALS — BP 140/90 | HR 72 | Temp 98.1°F | Ht 66.5 in | Wt 190.1 lb

## 2016-07-15 DIAGNOSIS — I872 Venous insufficiency (chronic) (peripheral): Secondary | ICD-10-CM | POA: Diagnosis not present

## 2016-07-15 DIAGNOSIS — Z86010 Personal history of colonic polyps: Secondary | ICD-10-CM | POA: Diagnosis not present

## 2016-07-15 DIAGNOSIS — E7800 Pure hypercholesterolemia, unspecified: Secondary | ICD-10-CM

## 2016-07-15 DIAGNOSIS — K625 Hemorrhage of anus and rectum: Secondary | ICD-10-CM

## 2016-07-15 HISTORY — DX: Pure hypercholesterolemia, unspecified: E78.00

## 2016-07-15 NOTE — Progress Notes (Signed)
Subjective:    Patient ID: Tami Love, female    DOB: 1946/04/12, 71 y.o.   MRN: [id number]  HPI Referred by Dr. Willey Blade for rectal bleeding. She states when she wipes she has seen some blood. She sees blood on the toilet tissue. Symptoms for several months.  She says it is harder and harder for her to clean her rectum. Small amt rectal discharge (mucous). There has been no weight loss. Her appetite is good.  BMs x 1-2 a day. No change in caliber of stools.   06/07/2016 H and H  12.6 and 39.8     04/29/2011:   Colonoscopy   Indications:  Patient is 71 year old Caucasian female with history of colonic colonic adenomas who is here for surveillance examination. Her last exam was in September 2007.Impression:  Examination performed to cecum. Multiple diverticula at sigmoid colon and few more at hepatic flexure. Two small polyps ablated via cold biopsy; one from splenic flexure and the second one from sigmoid colon.Biopsy results reviewed with the patient. Both polyps are tubular adenomas but they were small. can wait 7 years before her next colonoscopy.  Review of Systems Past Medical History:  Diagnosis Date  . Angina    remote history  . Anxiety   . Arthritis   . Body aches 05/08/2014  . Cancer ([place]) 2000   ovarian cancer  . Colon polyps   . GERD (gastroesophageal reflux disease)   . Hemorrhoids   . High cholesterol 07/15/2016  . Hyperlipidemia     Past Surgical History:  Procedure Laterality Date  . ABDOMINAL HYSTERECTOMY  2000   ovarian  . CATARACT EXTRACTION W/PHACO  04/10/2012   Procedure: CATARACT EXTRACTION PHACO AND INTRAOCULAR LENS PLACEMENT (IOC);  Surgeon: Tonny Branch, MD;  Location: [facility];  Service: Ophthalmology;  Laterality: Left;  CDE 17.03  . COLONOSCOPY  04/29/2011   Procedure: COLONOSCOPY;  Surgeon: Rogene Houston, MD;  Location: [facility];  Service: Endoscopy;  Laterality: N/A;  9:30  . EYE SURGERY  2011   [facility]-right cataract  . TOTAL  ABDOMINAL HYSTERECTOMY W/ BILATERAL SALPINGOOPHORECTOMY  11/24/1998  . TOTAL HIP ARTHROPLASTY  2010, 2007   bilateral    Allergies  Allergen Reactions  . Iohexol   . Ivp Dye [Iodinated Diagnostic Agents]   . Neosporin [Neomycin-Bacitracin Zn-Polymyx] Rash    Current Outpatient Prescriptions on File Prior to Visit  Medication Sig Dispense Refill  . acetaminophen (TYLENOL) 500 MG tablet Take 1,000 mg by mouth every 6 (six) hours as needed. Arthritis pain     . celecoxib (CELEBREX) 200 MG capsule Take 200 mg by mouth as needed.    . clobetasol cream (TEMOVATE) [id number] % Apply 1 application topically as needed.     . sertraline (ZOLOFT) 50 MG tablet Take 50 mg by mouth daily.     . simvastatin (ZOCOR) 40 MG tablet Take 40 mg by mouth at bedtime.       No current facility-administered medications on file prior to visit.        Objective:   Physical Exam Blood pressure 140/90, pulse 72, temperature 98.1 F (36.7 C), height 5' 6.5" (1.689 m), weight 190 lb 1.6 oz (86.2 kg). Alert and oriented. Skin warm and dry. Oral mucosa is moist.   . Sclera anicteric, conjunctivae is pink. Thyroid not enlarged. No cervical lymphadenopathy. Lungs clear. Heart regular rate and rhythm.  Abdomen is soft. Bowel sounds are positive. No hepatomegaly. No abdominal masses felt. No tenderness.  No  edema to lower extremities.  .       Assessment & Plan:  Rectal bleeding. Colonic neoplasm needs to be ruled out. Colonoscopy. The risks and benefits such as perforation, bleeding, and infection were reviewed with the patient and is agreeable.

## 2016-07-15 NOTE — Patient Instructions (Signed)
The risks and benefits such as perforation, bleeding, and infection were reviewed with the patient and is agreeable. 

## 2016-07-16 ENCOUNTER — Encounter (INDEPENDENT_AMBULATORY_CARE_PROVIDER_SITE_OTHER): Payer: Self-pay

## 2016-07-28 DIAGNOSIS — M19072 Primary osteoarthritis, left ankle and foot: Secondary | ICD-10-CM | POA: Diagnosis not present

## 2016-07-30 ENCOUNTER — Encounter (HOSPITAL_COMMUNITY)
Admission: RE | Disposition: A | Discharge status: Home / Self Care | Payer: Self-pay | Source: Ambulatory Visit | Attending: Internal Medicine

## 2016-07-30 ENCOUNTER — Encounter (HOSPITAL_COMMUNITY): Payer: Self-pay | Admitting: *Deleted

## 2016-07-30 ENCOUNTER — Ambulatory Visit (HOSPITAL_COMMUNITY)
Admission: RE | Admit: 2016-07-30 | Discharge: 2016-07-30 | Disposition: A | Discharge status: Home / Self Care | Payer: Medicare HMO | Source: Ambulatory Visit | Attending: Internal Medicine | Admitting: Internal Medicine

## 2016-07-30 DIAGNOSIS — K219 Gastro-esophageal reflux disease without esophagitis: Secondary | ICD-10-CM | POA: Diagnosis not present

## 2016-07-30 DIAGNOSIS — K921 Melena: Secondary | ICD-10-CM | POA: Diagnosis not present

## 2016-07-30 DIAGNOSIS — K5730 Diverticulosis of large intestine without perforation or abscess without bleeding: Secondary | ICD-10-CM | POA: Diagnosis not present

## 2016-07-30 DIAGNOSIS — K644 Residual hemorrhoidal skin tags: Secondary | ICD-10-CM | POA: Insufficient documentation

## 2016-07-30 DIAGNOSIS — E7800 Pure hypercholesterolemia, unspecified: Secondary | ICD-10-CM | POA: Diagnosis not present

## 2016-07-30 DIAGNOSIS — E785 Hyperlipidemia, unspecified: Secondary | ICD-10-CM | POA: Insufficient documentation

## 2016-07-30 DIAGNOSIS — R69 Illness, unspecified: Secondary | ICD-10-CM | POA: Diagnosis not present

## 2016-07-30 DIAGNOSIS — Z86010 Personal history of colonic polyps: Secondary | ICD-10-CM | POA: Insufficient documentation

## 2016-07-30 DIAGNOSIS — Z79899 Other long term (current) drug therapy: Secondary | ICD-10-CM | POA: Insufficient documentation

## 2016-07-30 DIAGNOSIS — K625 Hemorrhage of anus and rectum: Secondary | ICD-10-CM

## 2016-07-30 DIAGNOSIS — M1990 Unspecified osteoarthritis, unspecified site: Secondary | ICD-10-CM | POA: Diagnosis not present

## 2016-07-30 DIAGNOSIS — F419 Anxiety disorder, unspecified: Secondary | ICD-10-CM | POA: Insufficient documentation

## 2016-07-30 DIAGNOSIS — Z8543 Personal history of malignant neoplasm of ovary: Secondary | ICD-10-CM | POA: Insufficient documentation

## 2016-07-30 HISTORY — PX: COLONOSCOPY: SHX5424

## 2016-07-30 SURGERY — COLONOSCOPY
Anesthesia: Moderate Sedation

## 2016-07-30 MED ORDER — PSYLLIUM 28 % PO PACK: 1.0000 | PACK | Freq: Every day | ORAL | Status: DC

## 2016-07-30 MED ORDER — SODIUM CHLORIDE 0.9 % IV SOLN
INTRAVENOUS | Status: DC
  Administered 2016-07-30: 13:00:00 via INTRAVENOUS

## 2016-07-30 MED ORDER — MIDAZOLAM HCL 5 MG/5ML IJ SOLN
INTRAMUSCULAR | Status: DC | PRN
  Administered 2016-07-30 (×3): 2 mg via INTRAVENOUS

## 2016-07-30 MED ORDER — STERILE WATER FOR IRRIGATION IR SOLN
Status: DC | PRN
  Administered 2016-07-30: 15:00:00

## 2016-07-30 MED ORDER — MIDAZOLAM HCL 5 MG/5ML IJ SOLN
INTRAMUSCULAR | Status: AC
  Filled 2016-07-30: qty 10

## 2016-07-30 MED ORDER — MEPERIDINE HCL 50 MG/ML IJ SOLN
INTRAMUSCULAR | Status: AC
  Filled 2016-07-30: qty 1

## 2016-07-30 MED ORDER — MEPERIDINE HCL 50 MG/ML IJ SOLN
INTRAMUSCULAR | Status: DC | PRN
  Administered 2016-07-30 (×2): 25 mg via INTRAVENOUS

## 2016-07-30 NOTE — Discharge Instructions (Signed)
Resume usual medications and high fiber diet. Metamucil 3-4 g by mouth daily at bedtime. No driving for 24 hours. Next colonoscopy in 7 years.   Colonoscopy, Adult, Care After This sheet gives you information about how to care for yourself after your procedure. Your health care provider may also give you more specific instructions. If you have problems or questions, contact your health care provider. What can I expect after the procedure? After the procedure, it is common to have:  A small amount of blood in your stool for 24 hours after the procedure.  Some gas.  Mild abdominal cramping or bloating. Follow these instructions at home: General instructions    For the first 24 hours after the procedure:  Do not drive or use machinery.  Do not sign important documents.  Do not drink alcohol.  Do your regular daily activities at a slower pace than normal.  Eat soft, easy-to-digest foods.  Rest often.  Take over-the-counter or prescription medicines only as told by your health care provider.  It is up to you to get the results of your procedure. Ask your health care provider, or the department performing the procedure, when your results will be ready. Relieving cramping and bloating   Try walking around when you have cramps or feel bloated.  Apply heat to your abdomen as told by your health care provider. Use a heat source that your health care provider recommends, such as a moist heat pack or a heating pad.  Place a towel between your skin and the heat source.  Leave the heat on for 20-30 minutes.  Remove the heat if your skin turns bright red. This is especially important if you are unable to feel pain, heat, or cold. You may have a greater risk of getting burned. Eating and drinking   Drink enough fluid to keep your urine clear or pale yellow.  Resume your normal diet as instructed by your health care provider. Avoid heavy or fried foods that are hard to  digest.  Avoid drinking alcohol for as long as instructed by your health care provider. Contact a health care provider if:  You have blood in your stool 2-3 days after the procedure. Get help right away if:  You have more than a small spotting of blood in your stool.  You pass large blood clots in your stool.  Your abdomen is swollen.  You have nausea or vomiting.  You have a fever.  You have increasing abdominal pain that is not relieved with medicine. This information is not intended to replace advice given to you by your health care provider. Make sure you discuss any questions you have with your health care provider. Document Released: 12/30/2003 Document Revised: 02/09/2016 Document Reviewed: 07/29/2015 Elsevier Interactive Patient Education  2017 [place].   High-Fiber Diet Fiber, also called dietary fiber, is a type of carbohydrate found in fruits, vegetables, whole grains, and beans. A high-fiber diet can have many health benefits. Your health care provider may recommend a high-fiber diet to help:  Prevent constipation. Fiber can make your bowel movements more regular.  Lower your cholesterol.  Relieve hemorrhoids, uncomplicated diverticulosis, or irritable bowel syndrome.  Prevent overeating as part of a weight-loss plan.  Prevent heart disease, type 2 diabetes, and certain cancers. What is my plan? The recommended daily intake of fiber includes:  38 grams for men under age 75.  45 grams for men over age 34.  85 grams for women under age 68.  40  grams for women over age 33. You can get the recommended daily intake of dietary fiber by eating a variety of fruits, vegetables, grains, and beans. Your health care provider may also recommend a fiber supplement if it is not possible to get enough fiber through your diet. What do I need to know about a high-fiber diet?  Fiber supplements have not been widely studied for their effectiveness, so it is better to  get fiber through food sources.  Always check the fiber content on thenutrition facts label of any prepackaged food. Look for foods that contain at least 5 grams of fiber per serving.  Ask your dietitian if you have questions about specific foods that are related to your condition, especially if those foods are not listed in the following section.  Increase your daily fiber consumption gradually. Increasing your intake of dietary fiber too quickly may cause bloating, cramping, or gas.  Drink plenty of water. Water helps you to digest fiber. What foods can I eat? Grains  Whole-grain breads. Multigrain cereal. Oats and oatmeal. Brown rice. Barley. Bulgur wheat. [place]. Bran muffins. Popcorn. Rye wafer crackers. Vegetables  Sweet potatoes. Spinach. Kale. Artichokes. Cabbage. Broccoli. Green peas. Carrots. Squash. Fruits  Berries. Pears. Apples. Oranges. Avocados. Prunes and raisins. Dried figs. Meats and Other Protein Sources  Navy, kidney, pinto, and soy beans. Split peas. Lentils. Nuts and seeds. Dairy  Fiber-fortified yogurt. Beverages  Fiber-fortified soy milk. Fiber-fortified orange juice. Other  Fiber bars. The items listed above may not be a complete list of recommended foods or beverages. Contact your dietitian for more options.  What foods are not recommended? Grains  White bread. Pasta made with refined flour. White rice. Vegetables  Fried potatoes. Canned vegetables. Well-cooked vegetables. Fruits  Fruit juice. Cooked, strained fruit. Meats and Other Protein Sources  Fatty cuts of meat. Fried Sales executive or fried fish. Dairy  Milk. Yogurt. Cream cheese. Sour cream. Beverages  Soft drinks. Other  Cakes and pastries. Butter and oils. The items listed above may not be a complete list of foods and beverages to avoid. Contact your dietitian for more information.  What are some tips for including high-fiber foods in my diet?  Eat a wide variety of high-fiber foods.  Make  sure that half of all grains consumed each day are whole grains.  Replace breads and cereals made from refined flour or white flour with whole-grain breads and cereals.  Replace white rice with brown rice, bulgur wheat, or millet.  Start the day with a breakfast that is high in fiber, such as a cereal that contains at least 5 grams of fiber per serving.  Use beans in place of meat in soups, salads, or pasta.  Eat high-fiber snacks, such as berries, raw vegetables, nuts, or popcorn. This information is not intended to replace advice given to you by your health care provider. Make sure you discuss any questions you have with your health care provider. Document Released: 05/17/2005 Document Revised: 10/23/2015 Document Reviewed: 10/30/2013 Elsevier Interactive Patient Education  2017 [employer].

## 2016-07-30 NOTE — H&P (Signed)
Tami Love is an 71 y.o. female.   Chief Complaint: Patient is here for colonoscopy. HPI: This 71 year old Caucasian female was history of colonic adenomas and whose last colonoscopy was in November 2012 presents with intermittent hematochezia. She has irregular bowel movements. She has diarrhea no constipation. When she sees blood its small in amount. She has good appetite and her weight has been stable. Family history is negative for CRC. Positive for non-GI malignancies. Mother died of uterine carcinoma at 81 and father died of lung carcinoma at 61.  Past Medical History:  Diagnosis Date  . Angina    remote history  . Anxiety   . Arthritis   . Body aches 05/08/2014  . Cancer ([place]) 2000   ovarian cancer  . Colon polyps   . GERD (gastroesophageal reflux disease)   . Hemorrhoids   . High cholesterol 07/15/2016  . Hyperlipidemia     Past Surgical History:  Procedure Laterality Date  . ABDOMINAL HYSTERECTOMY  2000   ovarian  . CATARACT EXTRACTION W/PHACO  04/10/2012   Procedure: CATARACT EXTRACTION PHACO AND INTRAOCULAR LENS PLACEMENT (IOC);  Surgeon: Tonny Branch, MD;  Location: [facility];  Service: Ophthalmology;  Laterality: Left;  CDE 17.03  . COLONOSCOPY  04/29/2011   Procedure: COLONOSCOPY;  Surgeon: Rogene Houston, MD;  Location: [facility] SUITE;  Service: Endoscopy;  Laterality: N/A;  9:30  . EYE SURGERY  2011   [facility]-right cataract  . TOTAL ABDOMINAL HYSTERECTOMY W/ BILATERAL SALPINGOOPHORECTOMY  11/24/1998  . TOTAL HIP ARTHROPLASTY  2010, 2007   bilateral    Family History  Problem Relation Age of Onset  . Cancer Mother     uterine  . Cancer Father     lung  . Heart disease Sister     heart attack  . Colon cancer Neg Hx    Social History:  reports that she has never smoked. She has never used smokeless tobacco. She reports that she drinks about 8.5 oz of alcohol per week . She reports that she does not use drugs.  Allergies:  Allergies  Allergen Reactions   . Ivp Dye [Iodinated Diagnostic Agents] Other (See Comments)    Uncontrollable sneezing  . Neosporin [Neomycin-Bacitracin Zn-Polymyx] Rash    Medications Prior to Admission  Medication Sig Dispense Refill  . acetaminophen (TYLENOL) 500 MG tablet Take 500 mg by mouth every 8 (eight) hours as needed for mild pain. Arthritis pain     . cetirizine (ZYRTEC) 10 MG tablet Take 10 mg by mouth daily.    . clobetasol cream (TEMOVATE) AB-123456789 % Apply 1 application topically 2 (two) times daily as needed (rash).     . sertraline (ZOLOFT) 50 MG tablet Take 50 mg by mouth daily.     . simvastatin (ZOCOR) 40 MG tablet Take 40 mg by mouth at bedtime.      . celecoxib (CELEBREX) 200 MG capsule Take 200 mg by mouth daily as needed (pain).       No results found for this or any previous visit (from the past 48 hour(s)). No results found.  ROS  Blood pressure (!) 153/79, pulse 95, temperature 97.5 F (36.4 C), temperature source Oral, resp. rate (!) 23, height 5' 6.5" (1.689 m), weight 190 lb (86.2 kg), SpO2 99 %. Physical Exam  Constitutional: She appears well-developed and well-nourished.  HENT:  Mouth/Throat: Oropharynx is clear and moist.  Eyes: Conjunctivae are normal. No scleral icterus.  Neck: No thyromegaly present.  Cardiovascular: Normal rate, regular rhythm  and normal heart sounds.   No murmur heard. Respiratory: Effort normal and breath sounds normal.  GI:  Abdomen is full but soft and nontender without organomegaly or masses.  Musculoskeletal: She exhibits no edema.  Lymphadenopathy:    She has no cervical adenopathy.  Neurological: She is alert.  Skin: Skin is warm and dry.     Assessment/Plan Hematochezia. History of colonic adenomas. Diagnostic colonoscopy.  Hildred Laser, MD 07/30/2016, 2:29 PM

## 2016-07-30 NOTE — Op Note (Signed)
[facility] Patient Name: Tami Love Procedure Date: 07/30/2016 2:17 PM MRN: [id number] Date of Birth: 1945/12/24 Attending MD: Hildred Laser , MD CSN: [id number] Age: 71 Admit Type: Outpatient Procedure:                Colonoscopy Indications:              Hematochezia Providers:                Hildred Laser, MD, Jeanann Lewandowsky. Sharon Seller, RN, Rosina Lowenstein, RN Referring MD:             Asencion Noble, MD Medicines:                Meperidine 50 mg IV, Midazolam 6 mg IV Complications:            No immediate complications. Estimated Blood Loss:     Estimated blood loss: none. Procedure:                Pre-Anesthesia Assessment:                           - Prior to the procedure, a History and Physical                            was performed, and patient medications and                            allergies were reviewed. The patient's tolerance of                            previous anesthesia was also reviewed. The risks                            and benefits of the procedure and the sedation                            options and risks were discussed with the patient.                            All questions were answered, and informed consent                            was obtained. Prior Anticoagulants: The patient has                            taken no previous anticoagulant or antiplatelet                            agents. ASA Grade Assessment: II - A patient with                            mild systemic disease. After reviewing the risks  and benefits, the patient was deemed in                            satisfactory condition to undergo the procedure.                           After obtaining informed consent, the colonoscope                            was passed under direct vision. Throughout the                            procedure, the patient's blood pressure, pulse, and                            oxygen saturations were  monitored continuously. The                            [id number]) scope was introduced through                            the anus and advanced to the the cecum, identified                            by appendiceal orifice and ileocecal valve. The                            colonoscopy was performed without difficulty. The                            patient tolerated the procedure well. The quality                            of the bowel preparation was adequate. The                            ileocecal valve, appendiceal orifice, and rectum                            were photographed. Scope In: 2:39:45 PM Scope Out: 2:58:13 PM Scope Withdrawal Time: 0 hours 7 minutes 4 seconds  Total Procedure Duration: 0 hours 18 minutes 28 seconds  Findings:      The perianal and digital rectal examinations were normal.      Multiple small and large-mouthed diverticula were found in the entire       colon.      External hemorrhoids were found during retroflexion. The hemorrhoids       were small. Impression:               - Diverticulosis in the entire examined colon.                           - External hemorrhoids.                           -  No specimens collected. Moderate Sedation:      Moderate (conscious) sedation was administered by the endoscopy nurse       and supervised by the endoscopist. The following parameters were       monitored: oxygen saturation, heart rate, blood pressure, CO2       capnography and response to care. Total physician intraservice time was       29 minutes. Recommendation:           - Patient has a contact number available for                            emergencies. The signs and symptoms of potential                            delayed complications were discussed with the                            patient. Return to normal activities tomorrow.                            Written discharge instructions were provided to the                             patient.                           - High fiber diet today.                           - Continue present medications.                           - Metamucil 4 g by mouth daily.                           - Repeat colonoscopy in 7 years for surveillance. Procedure Code(s):        --- Professional ---                           [phone], Colonoscopy, flexible; diagnostic, including                            collection of specimen(s) by brushing or washing,                            when performed (separate procedure)                           99152, Moderate sedation services provided by the                            same physician or other qualified health care                            professional performing the diagnostic or  therapeutic service that the sedation supports,                            requiring the presence of an independent trained                            observer to assist in the monitoring of the                            patient's level of consciousness and physiological                            status; initial 15 minutes of intraservice time,                            patient age 41 years or older                           [phone], Moderate sedation services; each additional                            15 minutes intraservice time Diagnosis Code(s):        --- Professional ---                           K64.4, Residual hemorrhoidal skin tags                           K92.1, Melena (includes Hematochezia)                           K57.30, Diverticulosis of large intestine without                            perforation or abscess without bleeding CPT copyright 2016 American Medical Association. All rights reserved. The codes documented in this report are preliminary and upon coder review may  be revised to meet current compliance requirements. Hildred Laser, MD Hildred Laser, MD 07/30/2016 3:07:22 PM This report has been signed  electronically. Number of Addenda: 0

## 2016-08-03 ENCOUNTER — Encounter (HOSPITAL_COMMUNITY): Payer: Self-pay | Admitting: Internal Medicine

## 2016-09-08 ENCOUNTER — Other Ambulatory Visit (HOSPITAL_COMMUNITY): Payer: Self-pay | Admitting: Internal Medicine

## 2016-09-08 DIAGNOSIS — Z1231 Encounter for screening mammogram for malignant neoplasm of breast: Secondary | ICD-10-CM

## 2016-09-22 ENCOUNTER — Ambulatory Visit (HOSPITAL_COMMUNITY)
Admission: RE | Admit: 2016-09-22 | Discharge: 2016-09-22 | Disposition: A | Discharge status: Home / Self Care | Payer: Medicare HMO | Source: Ambulatory Visit | Attending: Internal Medicine | Admitting: Internal Medicine

## 2016-09-22 DIAGNOSIS — Z1231 Encounter for screening mammogram for malignant neoplasm of breast: Secondary | ICD-10-CM | POA: Insufficient documentation

## 2016-10-19 DIAGNOSIS — R69 Illness, unspecified: Secondary | ICD-10-CM | POA: Diagnosis not present

## 2017-03-09 DIAGNOSIS — Z23 Encounter for immunization: Secondary | ICD-10-CM | POA: Diagnosis not present

## 2017-03-24 DIAGNOSIS — S80812A Abrasion, left lower leg, initial encounter: Secondary | ICD-10-CM | POA: Diagnosis not present

## 2017-03-24 DIAGNOSIS — S8012XA Contusion of left lower leg, initial encounter: Secondary | ICD-10-CM | POA: Diagnosis not present

## 2017-03-30 DIAGNOSIS — T148XXA Other injury of unspecified body region, initial encounter: Secondary | ICD-10-CM | POA: Diagnosis not present

## 2017-03-30 DIAGNOSIS — R234 Changes in skin texture: Secondary | ICD-10-CM | POA: Diagnosis not present

## 2017-04-19 DIAGNOSIS — L308 Other specified dermatitis: Secondary | ICD-10-CM | POA: Diagnosis not present

## 2017-04-27 DIAGNOSIS — R69 Illness, unspecified: Secondary | ICD-10-CM | POA: Diagnosis not present

## 2017-06-13 DIAGNOSIS — E785 Hyperlipidemia, unspecified: Secondary | ICD-10-CM | POA: Diagnosis not present

## 2017-06-13 DIAGNOSIS — Z79899 Other long term (current) drug therapy: Secondary | ICD-10-CM | POA: Diagnosis not present

## 2017-06-13 DIAGNOSIS — M1990 Unspecified osteoarthritis, unspecified site: Secondary | ICD-10-CM | POA: Diagnosis not present

## 2017-06-20 DIAGNOSIS — Z6829 Body mass index (BMI) 29.0-29.9, adult: Secondary | ICD-10-CM | POA: Diagnosis not present

## 2017-06-20 DIAGNOSIS — M1990 Unspecified osteoarthritis, unspecified site: Secondary | ICD-10-CM | POA: Diagnosis not present

## 2017-06-20 DIAGNOSIS — E785 Hyperlipidemia, unspecified: Secondary | ICD-10-CM | POA: Diagnosis not present

## 2017-06-20 DIAGNOSIS — Z0001 Encounter for general adult medical examination with abnormal findings: Secondary | ICD-10-CM | POA: Diagnosis not present

## 2017-07-05 DIAGNOSIS — L570 Actinic keratosis: Secondary | ICD-10-CM | POA: Diagnosis not present

## 2017-07-05 DIAGNOSIS — L298 Other pruritus: Secondary | ICD-10-CM | POA: Diagnosis not present

## 2017-07-05 DIAGNOSIS — X32XXXD Exposure to sunlight, subsequent encounter: Secondary | ICD-10-CM | POA: Diagnosis not present

## 2017-09-13 ENCOUNTER — Other Ambulatory Visit (HOSPITAL_COMMUNITY): Payer: Self-pay | Admitting: Internal Medicine

## 2017-09-13 DIAGNOSIS — Z1231 Encounter for screening mammogram for malignant neoplasm of breast: Secondary | ICD-10-CM

## 2017-09-26 ENCOUNTER — Encounter (HOSPITAL_COMMUNITY): Payer: Self-pay

## 2017-09-26 ENCOUNTER — Ambulatory Visit (HOSPITAL_COMMUNITY)
Admission: RE | Admit: 2017-09-26 | Discharge: 2017-09-26 | Disposition: A | Discharge status: Home / Self Care | Payer: Medicare HMO | Source: Ambulatory Visit | Attending: Internal Medicine | Admitting: Internal Medicine

## 2017-09-26 DIAGNOSIS — Z1231 Encounter for screening mammogram for malignant neoplasm of breast: Secondary | ICD-10-CM | POA: Diagnosis present

## 2018-08-03 ENCOUNTER — Ambulatory Visit (INDEPENDENT_AMBULATORY_CARE_PROVIDER_SITE_OTHER): Payer: Medicare HMO | Admitting: Otolaryngology

## 2018-08-03 DIAGNOSIS — H6983 Other specified disorders of Eustachian tube, bilateral: Secondary | ICD-10-CM | POA: Diagnosis not present

## 2018-08-03 DIAGNOSIS — H906 Mixed conductive and sensorineural hearing loss, bilateral: Secondary | ICD-10-CM

## 2018-11-08 ENCOUNTER — Other Ambulatory Visit (HOSPITAL_COMMUNITY): Payer: Self-pay | Admitting: Internal Medicine

## 2018-11-08 DIAGNOSIS — Z1231 Encounter for screening mammogram for malignant neoplasm of breast: Secondary | ICD-10-CM

## 2018-12-21 ENCOUNTER — Other Ambulatory Visit: Payer: Self-pay

## 2018-12-21 ENCOUNTER — Ambulatory Visit (HOSPITAL_COMMUNITY)
Admission: RE | Admit: 2018-12-21 | Discharge: 2018-12-21 | Disposition: A | Discharge status: Home / Self Care | Payer: Medicare HMO | Source: Ambulatory Visit | Attending: Internal Medicine | Admitting: Internal Medicine

## 2018-12-21 DIAGNOSIS — Z1231 Encounter for screening mammogram for malignant neoplasm of breast: Secondary | ICD-10-CM | POA: Diagnosis not present

## 2019-01-25 ENCOUNTER — Other Ambulatory Visit: Payer: Medicare HMO | Admitting: Adult Health

## 2019-08-17 ENCOUNTER — Other Ambulatory Visit: Payer: Self-pay | Admitting: *Deleted

## 2019-08-17 DIAGNOSIS — M25569 Pain in unspecified knee: Secondary | ICD-10-CM

## 2019-08-21 ENCOUNTER — Ambulatory Visit (INDEPENDENT_AMBULATORY_CARE_PROVIDER_SITE_OTHER): Payer: Medicare HMO | Admitting: Vascular Surgery

## 2019-08-21 ENCOUNTER — Encounter: Payer: Self-pay | Admitting: Vascular Surgery

## 2019-08-21 ENCOUNTER — Ambulatory Visit (HOSPITAL_COMMUNITY)
Admission: RE | Admit: 2019-08-21 | Discharge: 2019-08-21 | Disposition: A | Discharge status: Home / Self Care | Payer: Medicare HMO | Source: Ambulatory Visit | Attending: Vascular Surgery | Admitting: Vascular Surgery

## 2019-08-21 ENCOUNTER — Other Ambulatory Visit: Payer: Self-pay

## 2019-08-21 VITALS — BP 134/75 | HR 68 | Temp 97.7°F | Resp 20 | Ht 66.5 in | Wt 176.0 lb

## 2019-08-21 DIAGNOSIS — M7989 Other specified soft tissue disorders: Secondary | ICD-10-CM

## 2019-08-21 DIAGNOSIS — M25569 Pain in unspecified knee: Secondary | ICD-10-CM | POA: Diagnosis present

## 2019-08-21 DIAGNOSIS — I8392 Asymptomatic varicose veins of left lower extremity: Secondary | ICD-10-CM | POA: Insufficient documentation

## 2019-08-21 NOTE — Progress Notes (Signed)
[facility] [place]  Patient name: Tami Love MRN: [id number] DOB: Jul 18, 1945 Sex: female  REASON FOR CONSULT: Evaluation bilateral lower extremity tenderness and swelling  HPI: Tami Love is a 74 y.o. female, who is here today for evaluation.  She has had progressive swelling in both lower extremities and reports of tenderness from her knees distally related to this.  She also has arthritic issues and is contemplating knee surgery.  She reports that her legs have minimal swelling when she first awakens in the morning but throughout the day the swelling increases as does the tenderness and achy sensation.  She has no history of venous stasis ulceration.  No history of DVT.  Past Medical History:  Diagnosis Date  . Angina    remote history  . Anxiety   . Arthritis   . Body aches 05/08/2014  . Cancer ([place]) 2000   ovarian cancer  . Colon polyps   . GERD (gastroesophageal reflux disease)   . Hemorrhoids   . High cholesterol 07/15/2016  . Hyperlipidemia     Family History  Problem Relation Age of Onset  . Cancer Mother        uterine  . Cancer Father        lung  . Heart disease Sister        heart attack  . Colon cancer Neg Hx     SOCIAL HISTORY: Social History   Socioeconomic History  . Marital status: Married    Spouse name: Not on file  . Number of children: Not on file  . Years of education: Not on file  . Highest education level: Not on file  Occupational History  . Not on file  Tobacco Use  . Smoking status: Never Smoker  . Smokeless tobacco: Never Used  Substance and Sexual Activity  . Alcohol use: Yes    Alcohol/week: 15.0 standard drinks    Types: 5 Glasses of wine, 5 Cans of beer, 5 Standard drinks or equivalent per week    Comment: daily-  . Drug use: No  . Sexual activity: Yes    Birth control/protection: Surgical    Comment: hyst  Other Topics Concern  . Not on file  Social  History Narrative  . Not on file   Social Determinants of Health   Financial Resource Strain:   . Difficulty of Paying Living Expenses:   Food Insecurity:   . Worried About Charity fundraiser in the Last Year:   . Arboriculturist in the Last Year:   Transportation Needs:   . Film/video editor (Medical):   Marland Kitchen Lack of Transportation (Non-Medical):   Physical Activity:   . Days of Exercise per Week:   . Minutes of Exercise per Session:   Stress:   . Feeling of Stress :   Social Connections:   . Frequency of Communication with Friends and Family:   . Frequency of Social Gatherings with Friends and Family:   . Attends Religious Services:   . Active Member of Clubs or Organizations:   . Attends Archivist Meetings:   Marland Kitchen Marital Status:   Intimate Partner Violence:   . Fear of Current or Ex-Partner:   . Emotionally Abused:   Marland Kitchen Physically Abused:   . Sexually Abused:     Allergies  Allergen Reactions  . Ivp Dye [Iodinated Diagnostic Agents] Other (See Comments)    Uncontrollable sneezing  . Neosporin [Neomycin-Bacitracin Zn-Polymyx] Rash  Current Outpatient Medications  Medication Sig Dispense Refill  . acetaminophen (TYLENOL) 500 MG tablet Take 500 mg by mouth every 8 (eight) hours as needed for mild pain. Arthritis pain     . celecoxib (CELEBREX) 200 MG capsule Take 200 mg by mouth daily as needed (pain).     . cetirizine (ZYRTEC) 10 MG tablet Take 10 mg by mouth daily.    . psyllium (METAMUCIL SMOOTH TEXTURE) 28 % packet Take 1 packet by mouth at bedtime.    . sertraline (ZOLOFT) 100 MG tablet Take 100 mg by mouth daily.    . simvastatin (ZOCOR) 40 MG tablet Take 40 mg by mouth at bedtime.      . clobetasol cream (TEMOVATE) AB-123456789 % Apply 1 application topically 2 (two) times daily as needed (rash).      No current facility-administered medications for this visit.    REVIEW OF SYSTEMS:  [X]  denotes positive finding, [ ]  denotes negative finding Cardiac   Comments:  Chest pain or chest pressure:    Shortness of breath upon exertion:    Short of breath when lying flat:    Irregular heart rhythm:        Vascular    Pain in calf, thigh, or hip brought on by ambulation: x   Pain in feet at night that wakes you up from your sleep:     Blood clot in your veins:    Leg swelling:  x       Pulmonary    Oxygen at home:    Productive cough:     Wheezing:         Neurologic    Sudden weakness in arms or legs:     Sudden numbness in arms or legs:     Sudden onset of difficulty speaking or slurred speech:    Temporary loss of vision in one eye:     Problems with dizziness:         Gastrointestinal    Blood in stool:     Vomited blood:         Genitourinary    Burning when urinating:     Blood in urine:        Psychiatric    Major depression:         Hematologic    Bleeding problems:    Problems with blood clotting too easily:        Skin    Rashes or ulcers:        Constitutional    Fever or chills:      PHYSICAL EXAM: Vitals:   08/21/19 1321  BP: 134/75  Pulse: 68  Resp: 20  Temp: 97.7 F (36.5 C)  SpO2: 100%  Weight: 176 lb (79.8 kg)  Height: 5' 6.5" (1.689 m)    GENERAL: The patient is a well-nourished female, in no acute distress. The vital signs are documented above. CARDIOVASCULAR: 2+ radial and 2+ dorsalis pedis pulses bilaterally.  She does have swelling from her knees distally and irritation erythema diffusely of her skin from her knees down to her ankles.  Does have scattered telangiectasia PULMONARY: There is good air exchange  ABDOMEN: Soft and non-tender  MUSCULOSKELETAL: There are no major deformities or cyanosis. NEUROLOGIC: No focal weakness or paresthesias are detected. SKIN: There are no ulcers or rashes noted. PSYCHIATRIC: The patient has a normal affect.  DATA:  She does have reflux in her right common femoral vein and left common femoral vein and saphenofemoral junction.  She has no reflux  throughout her saphenous vein bilaterally  MEDICAL ISSUES: I discussed this at length with the patient.  She does not have any correctable venous hypertension.  I did explain the critical importance of elevation to her.  Also discussed compression.  She does have compression garments but is been difficult to wear these typical difficulty in applying them and discomfort in wearing them.  I did explain that this would slow the progression of her swelling and venous stasis disease.  She was reassured with this discussion will see [place] again on an as-needed basis   Rosetta Posner, MD [facility] Tel [phone] Pager [phone]

## 2019-11-21 ENCOUNTER — Ambulatory Visit: Payer: Medicare HMO

## 2019-12-05 ENCOUNTER — Other Ambulatory Visit (HOSPITAL_COMMUNITY): Payer: Self-pay | Admitting: Internal Medicine

## 2019-12-05 DIAGNOSIS — Z1231 Encounter for screening mammogram for malignant neoplasm of breast: Secondary | ICD-10-CM

## 2019-12-26 ENCOUNTER — Other Ambulatory Visit: Payer: Self-pay

## 2019-12-26 ENCOUNTER — Ambulatory Visit (HOSPITAL_COMMUNITY)
Admission: RE | Admit: 2019-12-26 | Discharge: 2019-12-26 | Disposition: A | Discharge status: Home / Self Care | Payer: Medicare HMO | Source: Ambulatory Visit | Attending: Internal Medicine | Admitting: Internal Medicine

## 2019-12-26 DIAGNOSIS — Z1231 Encounter for screening mammogram for malignant neoplasm of breast: Secondary | ICD-10-CM | POA: Diagnosis not present

## 2020-01-09 ENCOUNTER — Ambulatory Visit (INDEPENDENT_AMBULATORY_CARE_PROVIDER_SITE_OTHER): Payer: Medicare HMO | Admitting: Adult Health

## 2020-01-09 ENCOUNTER — Encounter: Payer: Self-pay | Admitting: Adult Health

## 2020-01-09 VITALS — BP 125/70 | HR 70 | Ht 66.25 in | Wt 174.0 lb

## 2020-01-09 DIAGNOSIS — Z1211 Encounter for screening for malignant neoplasm of colon: Secondary | ICD-10-CM

## 2020-01-09 DIAGNOSIS — Z8543 Personal history of malignant neoplasm of ovary: Secondary | ICD-10-CM | POA: Diagnosis not present

## 2020-01-09 DIAGNOSIS — Z20822 Contact with and (suspected) exposure to covid-19: Secondary | ICD-10-CM

## 2020-01-09 DIAGNOSIS — Z01419 Encounter for gynecological examination (general) (routine) without abnormal findings: Secondary | ICD-10-CM

## 2020-01-09 DIAGNOSIS — Z01812 Encounter for preprocedural laboratory examination: Secondary | ICD-10-CM | POA: Insufficient documentation

## 2020-01-09 LAB — HEMOCCULT GUIAC POC 1CARD (OFFICE): Fecal Occult Blood, POC: NEGATIVE

## 2020-01-09 NOTE — Progress Notes (Signed)
  Subjective:     Patient ID: Tami Love, female   DOB: 1945-09-13, 74 y.o.   MRN: [id number]  HPI Tami Love is a 74 year old white female,married,sp TAHBSO in 2000 for ovarian cancer,in today for pelvic exam.She had COVID vaccine in June and had really bad headache and jaw pain about 10 days later and saw dentist, and has had upper respiratory symptoms since, had negative COVID test, just finished antibiotics from PCP. Stools are more often and looser and has wiped blood, no pain, had colonoscopy 2018. PCP is Dr Willey Blade.   Review of Systems Patient denies any daily headaches, hearing loss, fatigue, blurred vision, shortness of breath, chest pain, abdominal pain, problems with urination, or intercourse. No joint pain or mood swings. See HPI for positives  Reviewed past medical,surgical, social and family history. Reviewed medications and allergies.     Objective:   Physical Exam BP 125/70 (BP Location: Left Arm, Patient Position: Sitting, Cuff Size: Normal)   Pulse 70   Ht 5' 6.25" (1.683 m)   Wt 174 lb (78.9 kg)   BMI 27.87 kg/m   Skin warm and dry.Pelvic: external genitalia is normal in appearance no lesions, vagina: pale with loss of moisture and rugae,urethra has no lesions or masses noted, cervix and uterus are absent, adnexa: no masses or tenderness noted. Bladder is non tender and no masses felt. On rectal exam has good tone, +hemorrhoids, hemoccult negative. Examination chaperoned by Celene Squibb LPN    Assessment:      1. Normal pelvic exam Pelvic exam in 2 years  -physical with PCP Labs with PCP Mammogram yearly -colonoscopy per GI  2. Encounter for screening fecal occult blood testing   3. Encounter for screening laboratory testing for COVID-19 virus in asymptomatic patient Check COVID antibody   4. History of ovarian cancer     Plan:     Follow up in 2 years or sooner if needed

## 2020-01-10 ENCOUNTER — Telehealth: Payer: Self-pay | Admitting: Adult Health

## 2020-01-10 LAB — SARS-COV-2 SEMI-QUANTITATIVE TOTAL ANTIBODY, SPIKE
SARS-CoV-2 Semi-Quant Total Ab: 36.7 U/mL (ref <0.8)
SARS-CoV-2 Spike Ab Interp: POSITIVE

## 2020-01-10 NOTE — Telephone Encounter (Signed)
Left message that COVID antibody test is 36.7 so positive have antibodies against COVID

## 2020-01-10 NOTE — Telephone Encounter (Signed)
Pt aware that she has +COVID antibodies.

## 2020-06-30 DIAGNOSIS — H01001 Unspecified blepharitis right upper eyelid: Secondary | ICD-10-CM | POA: Diagnosis not present

## 2020-06-30 DIAGNOSIS — H02831 Dermatochalasis of right upper eyelid: Secondary | ICD-10-CM | POA: Diagnosis not present

## 2020-06-30 DIAGNOSIS — H26492 Other secondary cataract, left eye: Secondary | ICD-10-CM | POA: Diagnosis not present

## 2020-06-30 DIAGNOSIS — H02834 Dermatochalasis of left upper eyelid: Secondary | ICD-10-CM | POA: Diagnosis not present

## 2020-07-07 DIAGNOSIS — E785 Hyperlipidemia, unspecified: Secondary | ICD-10-CM | POA: Diagnosis not present

## 2020-07-07 DIAGNOSIS — M1990 Unspecified osteoarthritis, unspecified site: Secondary | ICD-10-CM | POA: Diagnosis not present

## 2020-07-07 DIAGNOSIS — F329 Major depressive disorder, single episode, unspecified: Secondary | ICD-10-CM | POA: Diagnosis not present

## 2020-07-07 DIAGNOSIS — Z79899 Other long term (current) drug therapy: Secondary | ICD-10-CM | POA: Diagnosis not present

## 2020-07-14 DIAGNOSIS — M1990 Unspecified osteoarthritis, unspecified site: Secondary | ICD-10-CM | POA: Diagnosis not present

## 2020-07-14 DIAGNOSIS — E785 Hyperlipidemia, unspecified: Secondary | ICD-10-CM | POA: Diagnosis not present

## 2020-07-14 DIAGNOSIS — F325 Major depressive disorder, single episode, in full remission: Secondary | ICD-10-CM | POA: Diagnosis not present

## 2020-07-14 DIAGNOSIS — Z8543 Personal history of malignant neoplasm of ovary: Secondary | ICD-10-CM | POA: Diagnosis not present

## 2020-08-08 DIAGNOSIS — H5212 Myopia, left eye: Secondary | ICD-10-CM | POA: Diagnosis not present

## 2020-08-11 DIAGNOSIS — H524 Presbyopia: Secondary | ICD-10-CM | POA: Diagnosis not present

## 2020-08-18 DIAGNOSIS — M5442 Lumbago with sciatica, left side: Secondary | ICD-10-CM | POA: Diagnosis not present

## 2020-08-18 DIAGNOSIS — M25562 Pain in left knee: Secondary | ICD-10-CM | POA: Diagnosis not present

## 2020-08-18 DIAGNOSIS — M545 Low back pain, unspecified: Secondary | ICD-10-CM | POA: Diagnosis not present

## 2020-08-18 DIAGNOSIS — M5441 Lumbago with sciatica, right side: Secondary | ICD-10-CM | POA: Diagnosis not present

## 2020-09-05 DIAGNOSIS — M545 Low back pain, unspecified: Secondary | ICD-10-CM | POA: Diagnosis not present

## 2020-09-16 DIAGNOSIS — M5442 Lumbago with sciatica, left side: Secondary | ICD-10-CM | POA: Diagnosis not present

## 2020-09-16 DIAGNOSIS — M5441 Lumbago with sciatica, right side: Secondary | ICD-10-CM | POA: Diagnosis not present

## 2020-09-16 DIAGNOSIS — M545 Low back pain, unspecified: Secondary | ICD-10-CM | POA: Diagnosis not present

## 2020-09-16 DIAGNOSIS — M25551 Pain in right hip: Secondary | ICD-10-CM | POA: Diagnosis not present

## 2020-11-19 DIAGNOSIS — Z1283 Encounter for screening for malignant neoplasm of skin: Secondary | ICD-10-CM | POA: Diagnosis not present

## 2020-11-19 DIAGNOSIS — Z85820 Personal history of malignant melanoma of skin: Secondary | ICD-10-CM | POA: Diagnosis not present

## 2020-11-19 DIAGNOSIS — Z08 Encounter for follow-up examination after completed treatment for malignant neoplasm: Secondary | ICD-10-CM | POA: Diagnosis not present

## 2020-11-19 DIAGNOSIS — D0462 Carcinoma in situ of skin of left upper limb, including shoulder: Secondary | ICD-10-CM | POA: Diagnosis not present

## 2020-11-19 DIAGNOSIS — D225 Melanocytic nevi of trunk: Secondary | ICD-10-CM | POA: Diagnosis not present

## 2020-12-16 DIAGNOSIS — M47816 Spondylosis without myelopathy or radiculopathy, lumbar region: Secondary | ICD-10-CM | POA: Diagnosis not present

## 2020-12-22 ENCOUNTER — Other Ambulatory Visit (HOSPITAL_COMMUNITY): Payer: Self-pay | Admitting: Internal Medicine

## 2020-12-22 DIAGNOSIS — Z1231 Encounter for screening mammogram for malignant neoplasm of breast: Secondary | ICD-10-CM

## 2020-12-29 ENCOUNTER — Ambulatory Visit (HOSPITAL_COMMUNITY)
Admission: RE | Admit: 2020-12-29 | Discharge: 2020-12-29 | Disposition: A | Discharge status: Home / Self Care | Payer: Medicare Other | Source: Ambulatory Visit | Attending: Internal Medicine | Admitting: Internal Medicine

## 2020-12-29 ENCOUNTER — Other Ambulatory Visit: Payer: Self-pay

## 2020-12-29 DIAGNOSIS — Z1231 Encounter for screening mammogram for malignant neoplasm of breast: Secondary | ICD-10-CM | POA: Diagnosis not present

## 2020-12-30 DIAGNOSIS — M47816 Spondylosis without myelopathy or radiculopathy, lumbar region: Secondary | ICD-10-CM | POA: Diagnosis not present

## 2020-12-31 DIAGNOSIS — L570 Actinic keratosis: Secondary | ICD-10-CM | POA: Diagnosis not present

## 2020-12-31 DIAGNOSIS — Z85828 Personal history of other malignant neoplasm of skin: Secondary | ICD-10-CM | POA: Diagnosis not present

## 2020-12-31 DIAGNOSIS — Z08 Encounter for follow-up examination after completed treatment for malignant neoplasm: Secondary | ICD-10-CM | POA: Diagnosis not present

## 2020-12-31 DIAGNOSIS — X32XXXD Exposure to sunlight, subsequent encounter: Secondary | ICD-10-CM | POA: Diagnosis not present

## 2021-03-10 DIAGNOSIS — Z23 Encounter for immunization: Secondary | ICD-10-CM | POA: Diagnosis not present

## 2021-04-02 DIAGNOSIS — M1712 Unilateral primary osteoarthritis, left knee: Secondary | ICD-10-CM | POA: Diagnosis not present

## 2021-04-02 DIAGNOSIS — M79671 Pain in right foot: Secondary | ICD-10-CM | POA: Diagnosis not present

## 2021-04-02 DIAGNOSIS — M1711 Unilateral primary osteoarthritis, right knee: Secondary | ICD-10-CM | POA: Diagnosis not present

## 2021-04-02 DIAGNOSIS — M79672 Pain in left foot: Secondary | ICD-10-CM | POA: Diagnosis not present

## 2021-04-28 DIAGNOSIS — M19071 Primary osteoarthritis, right ankle and foot: Secondary | ICD-10-CM | POA: Diagnosis not present

## 2021-04-28 DIAGNOSIS — G5791 Unspecified mononeuropathy of right lower limb: Secondary | ICD-10-CM | POA: Diagnosis not present

## 2021-04-28 DIAGNOSIS — M25511 Pain in right shoulder: Secondary | ICD-10-CM | POA: Diagnosis not present

## 2021-04-28 DIAGNOSIS — M79674 Pain in right toe(s): Secondary | ICD-10-CM | POA: Diagnosis not present

## 2021-04-29 DIAGNOSIS — E785 Hyperlipidemia, unspecified: Secondary | ICD-10-CM | POA: Diagnosis not present

## 2021-04-29 DIAGNOSIS — K219 Gastro-esophageal reflux disease without esophagitis: Secondary | ICD-10-CM | POA: Diagnosis not present

## 2021-05-29 DIAGNOSIS — E785 Hyperlipidemia, unspecified: Secondary | ICD-10-CM | POA: Diagnosis not present

## 2021-05-29 DIAGNOSIS — K219 Gastro-esophageal reflux disease without esophagitis: Secondary | ICD-10-CM | POA: Diagnosis not present

## 2021-06-30 DIAGNOSIS — L570 Actinic keratosis: Secondary | ICD-10-CM | POA: Diagnosis not present

## 2021-06-30 DIAGNOSIS — Z08 Encounter for follow-up examination after completed treatment for malignant neoplasm: Secondary | ICD-10-CM | POA: Diagnosis not present

## 2021-06-30 DIAGNOSIS — C44729 Squamous cell carcinoma of skin of left lower limb, including hip: Secondary | ICD-10-CM | POA: Diagnosis not present

## 2021-06-30 DIAGNOSIS — Z85828 Personal history of other malignant neoplasm of skin: Secondary | ICD-10-CM | POA: Diagnosis not present

## 2021-06-30 DIAGNOSIS — X32XXXD Exposure to sunlight, subsequent encounter: Secondary | ICD-10-CM | POA: Diagnosis not present

## 2021-07-09 DIAGNOSIS — F32A Depression, unspecified: Secondary | ICD-10-CM | POA: Diagnosis not present

## 2021-07-09 DIAGNOSIS — Z8543 Personal history of malignant neoplasm of ovary: Secondary | ICD-10-CM | POA: Diagnosis not present

## 2021-07-09 DIAGNOSIS — M1990 Unspecified osteoarthritis, unspecified site: Secondary | ICD-10-CM | POA: Diagnosis not present

## 2021-07-09 DIAGNOSIS — E785 Hyperlipidemia, unspecified: Secondary | ICD-10-CM | POA: Diagnosis not present

## 2021-07-16 DIAGNOSIS — Z856 Personal history of leukemia: Secondary | ICD-10-CM | POA: Diagnosis not present

## 2021-07-16 DIAGNOSIS — F3340 Major depressive disorder, recurrent, in remission, unspecified: Secondary | ICD-10-CM | POA: Diagnosis not present

## 2021-07-16 DIAGNOSIS — Z0001 Encounter for general adult medical examination with abnormal findings: Secondary | ICD-10-CM | POA: Diagnosis not present

## 2021-07-16 DIAGNOSIS — M1990 Unspecified osteoarthritis, unspecified site: Secondary | ICD-10-CM | POA: Diagnosis not present

## 2021-07-28 DIAGNOSIS — M1712 Unilateral primary osteoarthritis, left knee: Secondary | ICD-10-CM | POA: Diagnosis not present

## 2021-07-28 DIAGNOSIS — M1711 Unilateral primary osteoarthritis, right knee: Secondary | ICD-10-CM | POA: Diagnosis not present

## 2021-08-11 DIAGNOSIS — X32XXXD Exposure to sunlight, subsequent encounter: Secondary | ICD-10-CM | POA: Diagnosis not present

## 2021-08-11 DIAGNOSIS — Z85828 Personal history of other malignant neoplasm of skin: Secondary | ICD-10-CM | POA: Diagnosis not present

## 2021-08-11 DIAGNOSIS — L570 Actinic keratosis: Secondary | ICD-10-CM | POA: Diagnosis not present

## 2021-08-11 DIAGNOSIS — Z08 Encounter for follow-up examination after completed treatment for malignant neoplasm: Secondary | ICD-10-CM | POA: Diagnosis not present

## 2021-08-20 IMAGING — MG DIGITAL SCREENING BILAT W/ TOMO W/ CAD
8 series · 8 of 24 positions shown · non-contrast
Comparison: Previous exam(s).

CLINICAL DATA: Screening.

EXAM:
DIGITAL SCREENING BILATERAL MAMMOGRAM WITH TOMO AND CAD

[L MLO synth-2D]
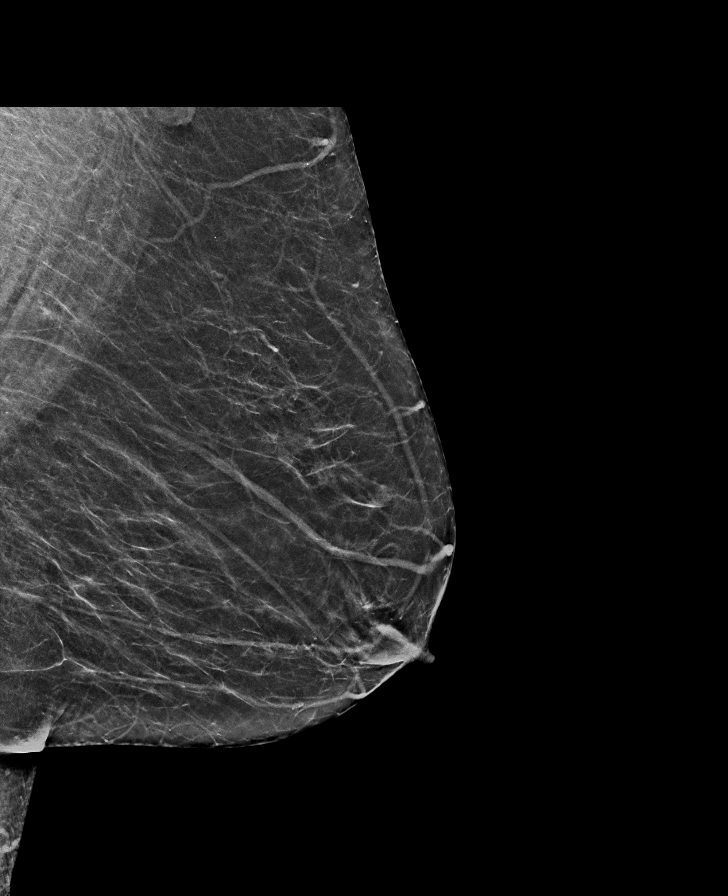

[R MLO synth-2D]
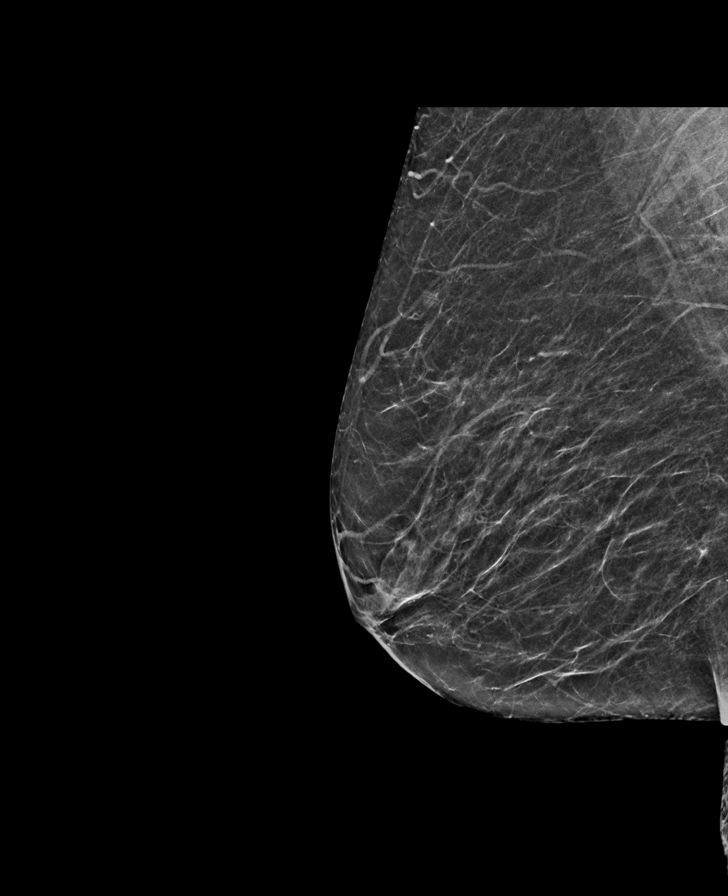

[R CC synth-2D]
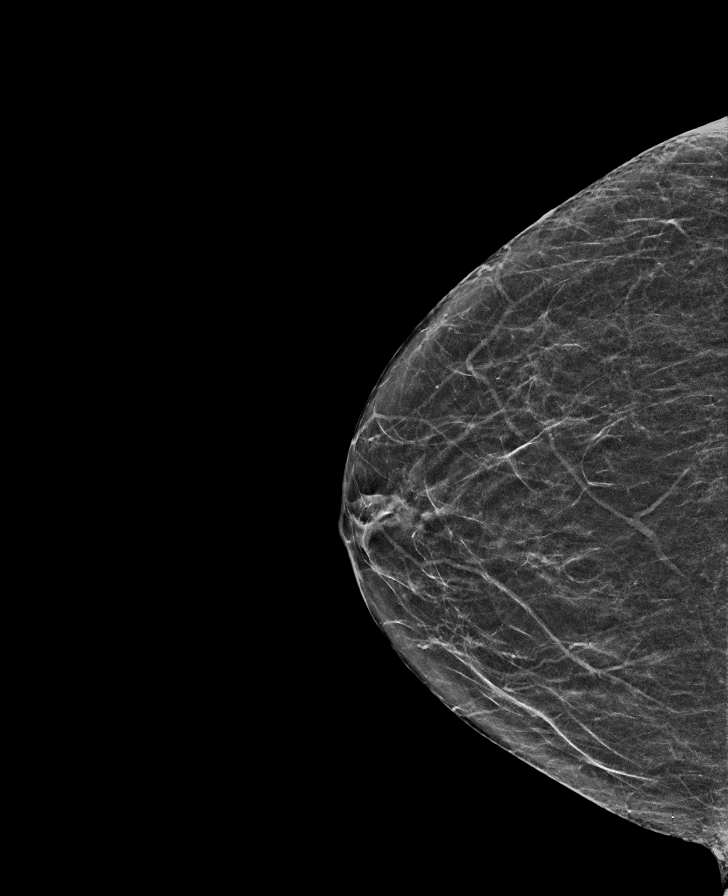

[L CC synth-2D]
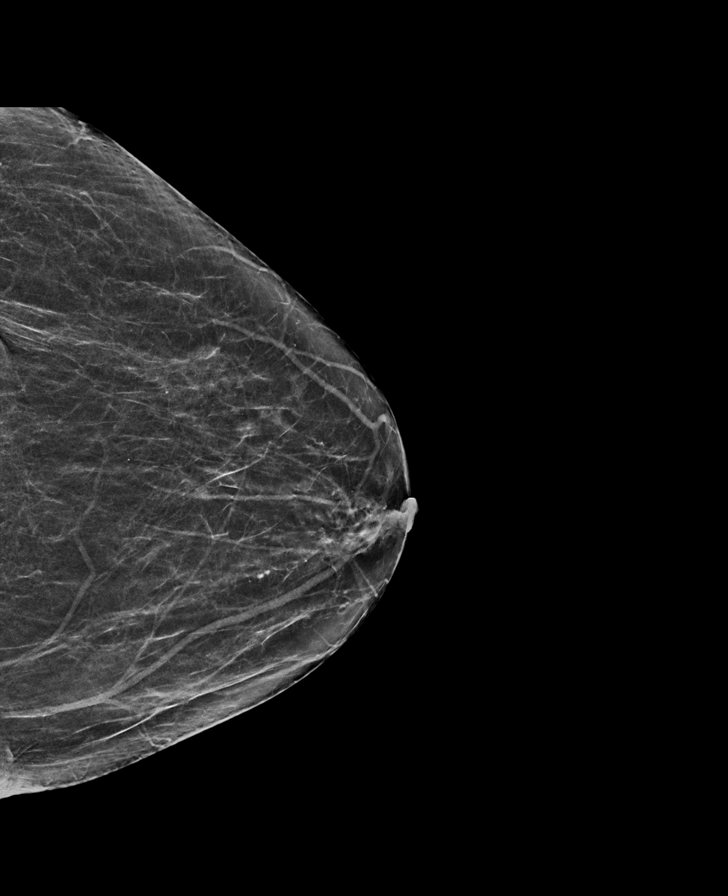

[R MLO tomo · tomo slice 26/51.0]
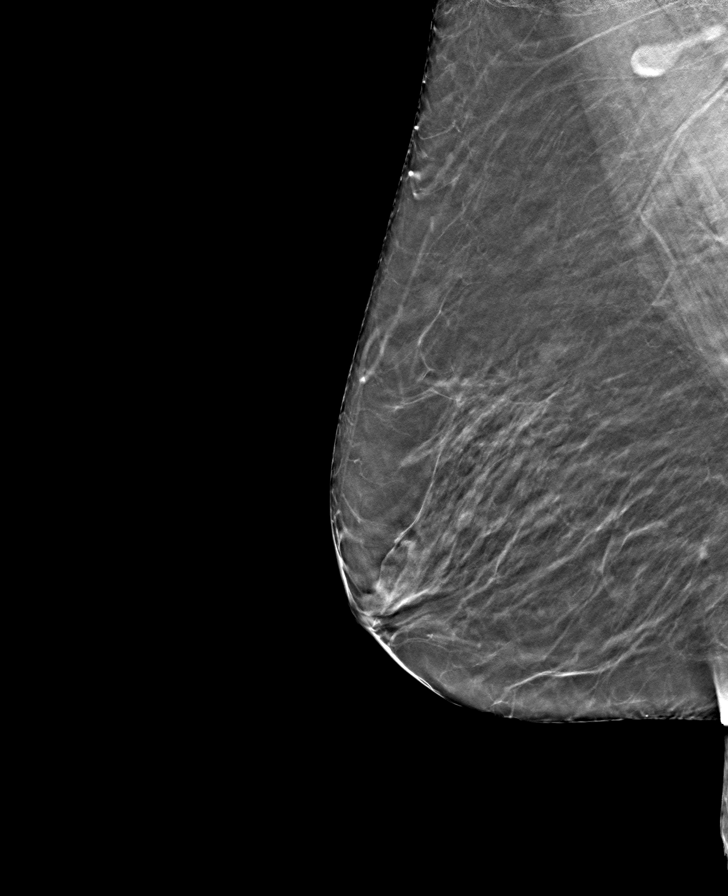

[L MLO tomo · tomo slice 27/52.0]
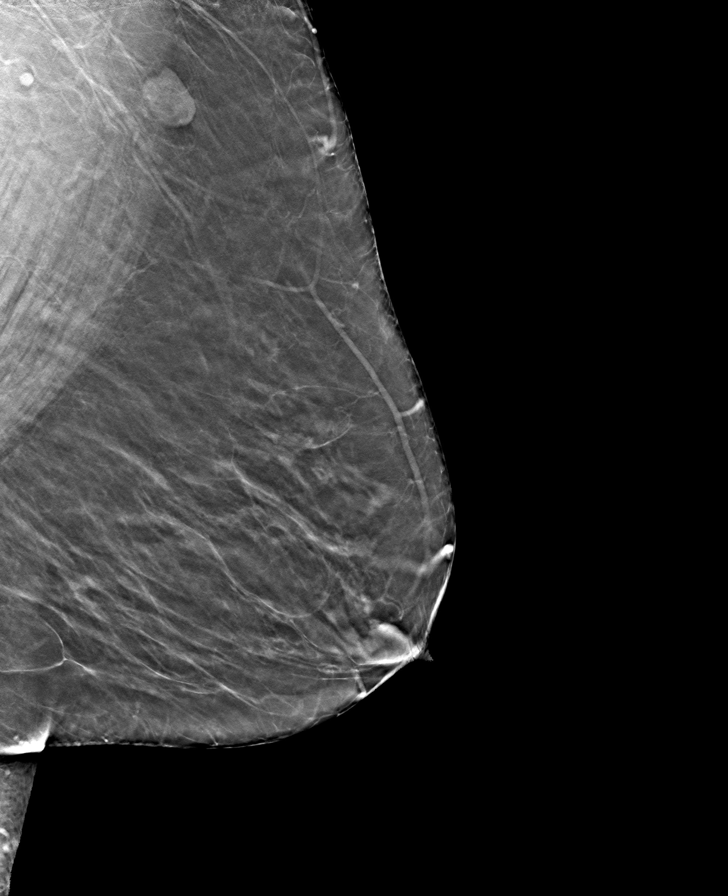

[L CC tomo · tomo slice 27/54.0]
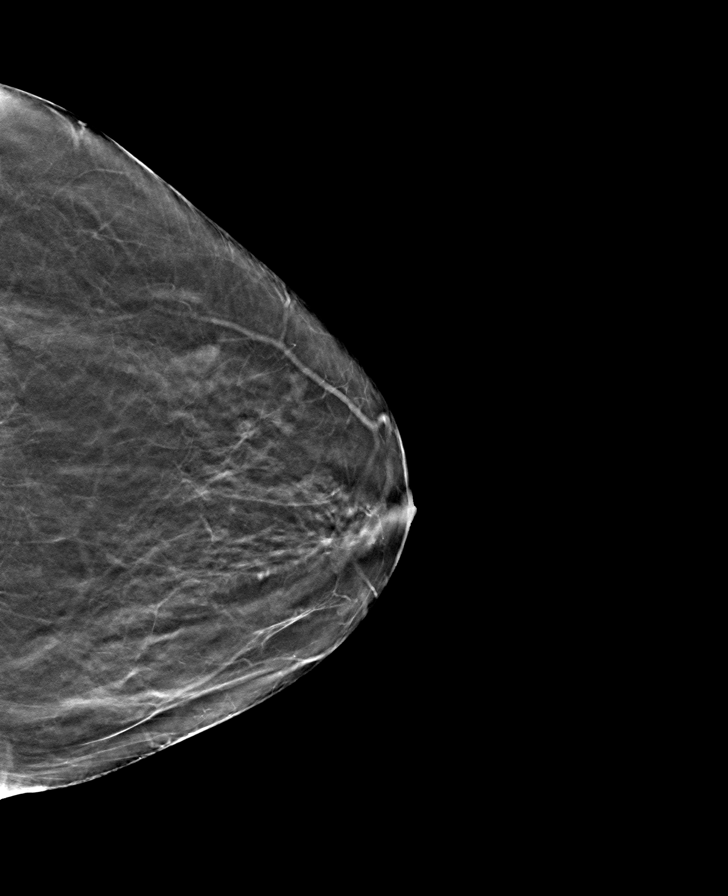

[R CC tomo · tomo slice 25/48.0]
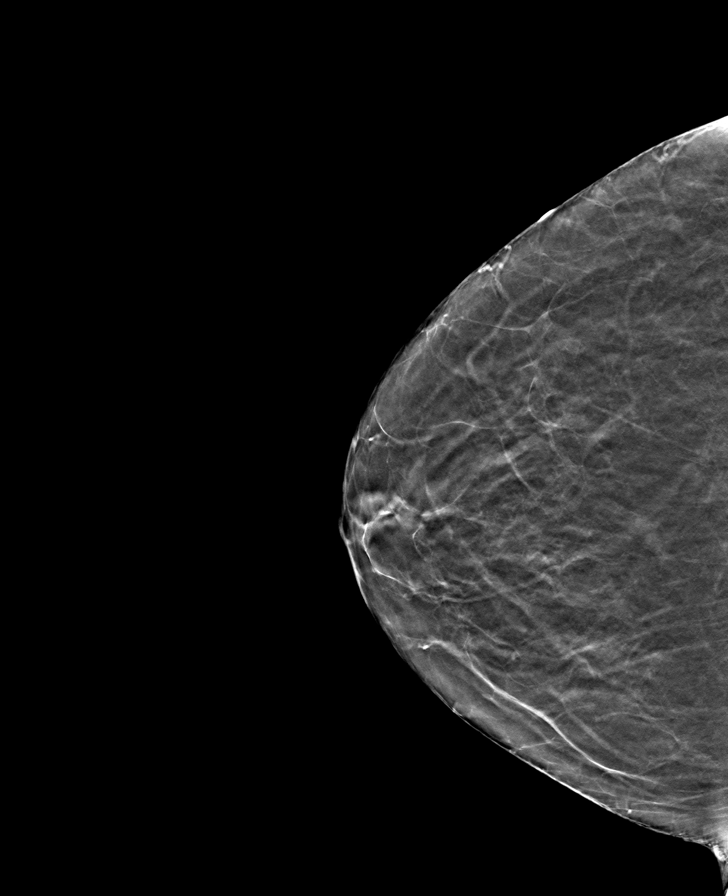

[8 of 24 positions shown; findings below may reference images not displayed]

ACR Breast Density Category b: There are scattered areas of
fibroglandular density.
FINDINGS: There are no findings suspicious for malignancy. Images were
processed with CAD.
IMPRESSION: No mammographic evidence of malignancy. A result letter of this
screening mammogram will be mailed directly to the patient.

RECOMMENDATION:
Screening mammogram in one year. (Code:[id number])

BI-RADS CATEGORY  1: Negative.

## 2021-09-01 DIAGNOSIS — M1711 Unilateral primary osteoarthritis, right knee: Secondary | ICD-10-CM | POA: Diagnosis not present

## 2021-09-01 DIAGNOSIS — M1712 Unilateral primary osteoarthritis, left knee: Secondary | ICD-10-CM | POA: Diagnosis not present

## 2021-09-08 DIAGNOSIS — M1712 Unilateral primary osteoarthritis, left knee: Secondary | ICD-10-CM | POA: Diagnosis not present

## 2021-09-08 DIAGNOSIS — M170 Bilateral primary osteoarthritis of knee: Secondary | ICD-10-CM | POA: Diagnosis not present

## 2021-09-08 DIAGNOSIS — M1711 Unilateral primary osteoarthritis, right knee: Secondary | ICD-10-CM | POA: Diagnosis not present

## 2021-09-15 DIAGNOSIS — M1711 Unilateral primary osteoarthritis, right knee: Secondary | ICD-10-CM | POA: Diagnosis not present

## 2021-09-15 DIAGNOSIS — M1712 Unilateral primary osteoarthritis, left knee: Secondary | ICD-10-CM | POA: Diagnosis not present

## 2022-01-05 DIAGNOSIS — S20461A Insect bite (nonvenomous) of right back wall of thorax, initial encounter: Secondary | ICD-10-CM | POA: Diagnosis not present

## 2022-01-05 DIAGNOSIS — L570 Actinic keratosis: Secondary | ICD-10-CM | POA: Diagnosis not present

## 2022-01-05 DIAGNOSIS — X32XXXD Exposure to sunlight, subsequent encounter: Secondary | ICD-10-CM | POA: Diagnosis not present

## 2022-01-08 ENCOUNTER — Other Ambulatory Visit (HOSPITAL_COMMUNITY): Payer: Self-pay | Admitting: Internal Medicine

## 2022-01-08 DIAGNOSIS — Z1231 Encounter for screening mammogram for malignant neoplasm of breast: Secondary | ICD-10-CM

## 2022-01-13 ENCOUNTER — Ambulatory Visit (HOSPITAL_COMMUNITY)
Admission: RE | Admit: 2022-01-13 | Discharge: 2022-01-13 | Disposition: A | Discharge status: Home / Self Care | Payer: Medicare Other | Source: Ambulatory Visit | Attending: Internal Medicine | Admitting: Internal Medicine

## 2022-01-13 DIAGNOSIS — Z1231 Encounter for screening mammogram for malignant neoplasm of breast: Secondary | ICD-10-CM | POA: Diagnosis not present

## 2022-02-11 DIAGNOSIS — M1712 Unilateral primary osteoarthritis, left knee: Secondary | ICD-10-CM | POA: Diagnosis not present

## 2022-02-11 DIAGNOSIS — M170 Bilateral primary osteoarthritis of knee: Secondary | ICD-10-CM | POA: Diagnosis not present

## 2022-02-11 DIAGNOSIS — M1711 Unilateral primary osteoarthritis, right knee: Secondary | ICD-10-CM | POA: Diagnosis not present

## 2022-03-01 DIAGNOSIS — Z23 Encounter for immunization: Secondary | ICD-10-CM | POA: Diagnosis not present

## 2022-03-11 DIAGNOSIS — H5213 Myopia, bilateral: Secondary | ICD-10-CM | POA: Diagnosis not present

## 2022-04-21 DIAGNOSIS — X32XXXD Exposure to sunlight, subsequent encounter: Secondary | ICD-10-CM | POA: Diagnosis not present

## 2022-04-21 DIAGNOSIS — L570 Actinic keratosis: Secondary | ICD-10-CM | POA: Diagnosis not present

## 2022-09-22 ENCOUNTER — Other Ambulatory Visit (HOSPITAL_COMMUNITY): Payer: Self-pay | Admitting: Internal Medicine

## 2022-09-22 DIAGNOSIS — R519 Headache, unspecified: Secondary | ICD-10-CM

## 2022-10-14 ENCOUNTER — Ambulatory Visit (HOSPITAL_COMMUNITY): Payer: Medicare Other

## 2022-10-14 ENCOUNTER — Encounter (HOSPITAL_COMMUNITY): Payer: Self-pay

## 2022-11-09 ENCOUNTER — Encounter: Payer: Self-pay | Admitting: Adult Health

## 2022-11-09 ENCOUNTER — Ambulatory Visit (INDEPENDENT_AMBULATORY_CARE_PROVIDER_SITE_OTHER): Payer: Medicare Other | Admitting: Adult Health

## 2022-11-09 VITALS — BP 149/80 | HR 61 | Ht 67.0 in | Wt 168.0 lb

## 2022-11-09 DIAGNOSIS — Z1211 Encounter for screening for malignant neoplasm of colon: Secondary | ICD-10-CM | POA: Diagnosis not present

## 2022-11-09 DIAGNOSIS — R102 Pelvic and perineal pain: Secondary | ICD-10-CM | POA: Diagnosis not present

## 2022-11-09 DIAGNOSIS — Z8543 Personal history of malignant neoplasm of ovary: Secondary | ICD-10-CM | POA: Diagnosis not present

## 2022-11-09 DIAGNOSIS — Z90710 Acquired absence of both cervix and uterus: Secondary | ICD-10-CM

## 2022-11-09 DIAGNOSIS — R10811 Right upper quadrant abdominal tenderness: Secondary | ICD-10-CM | POA: Diagnosis not present

## 2022-11-09 DIAGNOSIS — Z90721 Acquired absence of ovaries, unilateral: Secondary | ICD-10-CM

## 2022-11-09 LAB — HEMOCCULT GUIAC POC 1CARD (OFFICE): Fecal Occult Blood, POC: NEGATIVE

## 2022-11-09 NOTE — Progress Notes (Signed)
Patient ID: Tami Love, female   DOB: November 11, 1945, 77 y.o.   MRN: [id number] History of Present Illness: Tami Love is a 77 year old white female, married, sp TAHBSO in 2000, and had ovarian cancer. Has had 2 injections in back and took prednisone, and now stays stomach cramping and just does not feel right. She had labs and physical with Dr Ouida Sills in May.  PCP is Dr Ouida Sills   Current Medications, Allergies, Past Medical History, Past Surgical History, Family History and Social History were reviewed in [employer] electronic medical record.     Review of Systems: Has had 2 injections in back and took prednisone, and now stays stomach cramping and just does not feel right.  She does pee often and bowel movement may be constipation or diarrhea She is not sexually active.   Physical Exam:BP (!) 149/80 (BP Location: Right Arm, Patient Position: Sitting, Cuff Size: Large)   Pulse 61   Ht 5\' 7"  (1.702 m)   Wt 168 lb (76.2 kg)   Breastfeeding No   BMI 26.31 kg/m   General:  Well developed, well nourished, no acute distress Skin:  Warm and dry Neck:  Midline trachea, normal thyroid, good ROM, no lymphadenopathy,no carotid bruits heard Lungs; Clear to auscultation bilaterally Breast:  No dominant palpable mass, retraction, or nipple discharge Cardiovascular: Regular rate and rhythm Abdomen:  Soft, +RUQ tenderness, no hepatosplenomegaly Pelvic:  External genitalia is normal in appearance, no lesions.  The vagina is pale and atrophic. Urethra has no lesions or masses. The cervix and uterus are absent.  No adnexal masses or tenderness noted.Bladder is non tender, no masses felt. Rectal: Good sphincter tone, no polyps, + hemorrhoids felt.  Hemoccult negative. Extremities/musculoskeletal:  No swelling or varicosities noted, no clubbing or cyanosis Psych:  No mood changes, alert and cooperative,seems happy AA is 4 Fall risk is high     11/09/2022   11:42 AM 01/09/2020    9:35 AM 05/11/2016     9:23 AM  Depression screen PHQ 2/9  Decreased Interest 1 1 0  Down, Depressed, Hopeless 1 1 1   PHQ - 2 Score 2 2 1   Altered sleeping 1 0   Tired, decreased energy 1 1   Change in appetite 0 1   Feeling bad or failure about yourself  0 0   Trouble concentrating 1 1   Moving slowly or fidgety/restless 0 0   Suicidal thoughts 0 0   PHQ-9 Score 5 5        11/09/2022   11:42 AM 01/09/2020    9:35 AM  GAD 7 : Generalized Anxiety Score  Nervous, Anxious, on Edge 1 1  Control/stop worrying 1 1  Worry too much - different things 1 1  Trouble relaxing 1 1  Restless 1 0  Easily annoyed or irritable 1 0  Afraid - awful might happen 0 0  Total GAD 7 Score 6 4    Upstream - 11/09/22 1153       Pregnancy Intention Screening   Does the patient want to become pregnant in the next year? N/A    Does the patient's partner want to become pregnant in the next year? N/A    Would the patient like to discuss contraceptive options today? N/A      Contraception Wrap Up   Current Method Female Sterilization   hyst   End Method Female Sterilization   hyst   Contraception Counseling Provided No  Examination chaperoned by Malachy Mood LPN  Impression and plan: 1. Right upper quadrant abdominal tenderness without rebound tenderness +tenderness, no rebound, [place] scheduled for 11/24/22 at 10:30 am at [facility] to assess  - US Abdomen Limited; Future  2. Pelvic cramping +cramping [place] scheduled 11/24/22 at [facility] after ABD [place]  - US PELVIC COMPLETE WITH TRANSVAGINAL; Future If normal, refer to GI  3. Encounter for screening fecal occult blood testing Hemoccult negative - POCT occult blood stool  4. History of ovarian cancer [place] 11/24/22 at [facility]  - US PELVIC COMPLETE WITH TRANSVAGINAL; Future Pelvic exam in 2 years  5. S/P hysterectomy with oophorectomy - US PELVIC COMPLETE WITH TRANSVAGINAL; Future    Physical and labs with PCP Mammogram was negative 01/13/22

## 2022-11-24 ENCOUNTER — Other Ambulatory Visit: Payer: Self-pay | Admitting: Adult Health

## 2022-11-24 ENCOUNTER — Ambulatory Visit (HOSPITAL_COMMUNITY)
Admission: RE | Admit: 2022-11-24 | Discharge: 2022-11-24 | Disposition: A | Discharge status: Home / Self Care | Payer: Medicare Other | Source: Ambulatory Visit | Attending: Adult Health | Admitting: Adult Health

## 2022-11-24 DIAGNOSIS — Z90710 Acquired absence of both cervix and uterus: Secondary | ICD-10-CM

## 2022-11-24 DIAGNOSIS — Z1211 Encounter for screening for malignant neoplasm of colon: Secondary | ICD-10-CM

## 2022-11-24 DIAGNOSIS — Z90721 Acquired absence of ovaries, unilateral: Secondary | ICD-10-CM | POA: Insufficient documentation

## 2022-11-24 DIAGNOSIS — R10811 Right upper quadrant abdominal tenderness: Secondary | ICD-10-CM

## 2022-11-24 DIAGNOSIS — R102 Pelvic and perineal pain: Secondary | ICD-10-CM | POA: Diagnosis present

## 2022-11-24 DIAGNOSIS — Z8543 Personal history of malignant neoplasm of ovary: Secondary | ICD-10-CM | POA: Diagnosis present

## 2023-02-21 ENCOUNTER — Other Ambulatory Visit (HOSPITAL_COMMUNITY): Payer: Self-pay | Admitting: Internal Medicine

## 2023-02-21 DIAGNOSIS — Z1231 Encounter for screening mammogram for malignant neoplasm of breast: Secondary | ICD-10-CM

## 2023-02-23 ENCOUNTER — Ambulatory Visit (HOSPITAL_COMMUNITY)
Admission: RE | Admit: 2023-02-23 | Discharge: 2023-02-23 | Disposition: A | Discharge status: Home / Self Care | Payer: Medicare Other | Source: Ambulatory Visit | Attending: Internal Medicine | Admitting: Internal Medicine

## 2023-02-23 ENCOUNTER — Encounter (HOSPITAL_COMMUNITY): Payer: Self-pay

## 2023-02-23 DIAGNOSIS — Z1231 Encounter for screening mammogram for malignant neoplasm of breast: Secondary | ICD-10-CM | POA: Insufficient documentation

## 2023-07-15 ENCOUNTER — Encounter (INDEPENDENT_AMBULATORY_CARE_PROVIDER_SITE_OTHER): Payer: Self-pay | Admitting: *Deleted

## 2023-07-28 ENCOUNTER — Telehealth (INDEPENDENT_AMBULATORY_CARE_PROVIDER_SITE_OTHER): Payer: Self-pay | Admitting: Gastroenterology

## 2023-07-28 NOTE — Telephone Encounter (Signed)
 Who is your primary care physician: Carylon Perches  Reasons for the colonoscopy:   Have you had a colonoscopy before?  yes  Do you have family history of colon cancer? no  Previous colonoscopy with polyps removed? Yes 15 years ago Dr.Rehman  Do you have a history colorectal cancer?   no  Are you diabetic? If yes, Type 1 or Type 2?    no  Do you have a prosthetic or mechanical heart valve? no  Do you have a pacemaker/defibrillator?   no  Have you had endocarditis/atrial fibrillation? no  Have you had joint replacement within the last 12 months?  no  Do you tend to be constipated or have to use laxatives? yes  Do you have any history of drugs or alchohol?  Yes I do drink  Do you use supplemental oxygen?  no  Have you had a stroke or heart attack within the last 6 months? no  Do you take weight loss medication?  yes  For female patients: have you had a hysterectomy?   yes                                    are you post menopausal?                                            do you still have your menstrual cycle? no      Do you take any blood-thinning medications such as: (aspirin, warfarin, Plavix, Aggrenox)  no  If yes we need the name, milligram, dosage and who is prescribing doctor  Current Outpatient Medications on File Prior to Visit  Medication Sig Dispense Refill   acetaminophen (TYLENOL) 500 MG tablet Take 500 mg by mouth every 8 (eight) hours as needed for mild pain. Arthritis pain      celecoxib (CELEBREX) 200 MG capsule Take 200 mg by mouth daily as needed (pain).      cetirizine (ZYRTEC) 10 MG tablet Take 10 mg by mouth daily.     clobetasol cream (TEMOVATE) 0.05 % Apply 1 application  topically 2 (two) times daily as needed (rash). (Patient not taking: Reported on 07/28/2023)     sertraline (ZOLOFT) 100 MG tablet Take 100 mg by mouth daily.     simvastatin (ZOCOR) 40 MG tablet Take 40 mg by mouth at bedtime.       No current facility-administered medications on file  prior to visit.    Allergies  Allergen Reactions   Ivp Dye [Iodinated Contrast Media] Other (See Comments)    Uncontrollable sneezing   Neosporin [Neomycin-Bacitracin Zn-Polymyx] Rash     Pharmacy: [facility]  Primary Insurance Name: [employer]  Best number where you can be reached: [phone]

## 2023-07-28 NOTE — Telephone Encounter (Signed)
 Ok to schedule.  Room 1/2  Thanks,  Vista Lawman, MD Gastroenterology and Hepatology [facility]astroenter[facility]

## 2023-08-02 ENCOUNTER — Telehealth (INDEPENDENT_AMBULATORY_CARE_PROVIDER_SITE_OTHER): Payer: Self-pay | Admitting: Gastroenterology

## 2023-08-02 MED ORDER — CLENPIQ 10-3.5-12 MG-GM -GM/175ML PO SOLN: 1.0000 | ORAL | 0 refills | Status: DC

## 2023-08-02 MED ORDER — SUTAB 1479-225-188 MG PO TABS: ORAL_TABLET | ORAL | 0 refills | Status: DC

## 2023-08-02 NOTE — Telephone Encounter (Signed)
 Rep from [employer] called to check on TCS. Advised rep that when I did the PA online it stated no pa needed. Rep states if online said no pa needed, everything should be fine. Pt is out of date range for preventative.

## 2023-08-02 NOTE — Addendum Note (Signed)
 Addended by: Marlowe Shores on: 08/02/2023 12:10 PM   Modules accepted: Orders

## 2023-08-02 NOTE — Telephone Encounter (Signed)
 Questionnaire from recall, no referral needed

## 2023-08-02 NOTE — Telephone Encounter (Signed)
 Pt returned call. Pt states clenpiq is going to be around $100. Pt states the pharmacy told her that the pills would be cheaper. Will send pills to pharmacy and send new instructions via my chart

## 2023-08-02 NOTE — Addendum Note (Signed)
 Addended by: Marlowe Shores on: 08/02/2023 08:55 AM   Modules accepted: Orders

## 2023-08-02 NOTE — Telephone Encounter (Signed)
 Pt contacted and scheduled. Pt requested smaller volume prep due to being able to drink the gallon of prep. Clepiq sent to pharmacy. Instructions sent via my chart. No pa needed per insurance.

## 2023-08-12 ENCOUNTER — Other Ambulatory Visit: Payer: Self-pay

## 2023-08-12 ENCOUNTER — Encounter (HOSPITAL_COMMUNITY)
Admission: RE | Admit: 2023-08-12 | Discharge: 2023-08-12 | Disposition: A | Discharge status: Home / Self Care | Source: Ambulatory Visit | Attending: Gastroenterology | Admitting: Gastroenterology

## 2023-08-12 ENCOUNTER — Encounter (HOSPITAL_COMMUNITY): Payer: Self-pay

## 2023-08-16 ENCOUNTER — Ambulatory Visit (HOSPITAL_COMMUNITY): Admitting: Anesthesiology

## 2023-08-16 ENCOUNTER — Ambulatory Visit (HOSPITAL_COMMUNITY)
Admission: RE | Admit: 2023-08-16 | Discharge: 2023-08-16 | Disposition: A | Discharge status: Other Acute Inpatient Hospital | Attending: Gastroenterology | Admitting: Gastroenterology

## 2023-08-16 ENCOUNTER — Encounter (INDEPENDENT_AMBULATORY_CARE_PROVIDER_SITE_OTHER): Payer: Self-pay | Admitting: *Deleted

## 2023-08-16 ENCOUNTER — Encounter (HOSPITAL_COMMUNITY)
Admission: RE | Disposition: A | Discharge status: Other Acute Inpatient Hospital | Payer: Self-pay | Source: Home / Self Care | Attending: Gastroenterology

## 2023-08-16 DIAGNOSIS — K648 Other hemorrhoids: Secondary | ICD-10-CM | POA: Diagnosis not present

## 2023-08-16 DIAGNOSIS — K6389 Other specified diseases of intestine: Secondary | ICD-10-CM

## 2023-08-16 DIAGNOSIS — M1990 Unspecified osteoarthritis, unspecified site: Secondary | ICD-10-CM | POA: Diagnosis not present

## 2023-08-16 DIAGNOSIS — Z1211 Encounter for screening for malignant neoplasm of colon: Secondary | ICD-10-CM | POA: Diagnosis present

## 2023-08-16 DIAGNOSIS — K6289 Other specified diseases of anus and rectum: Secondary | ICD-10-CM | POA: Insufficient documentation

## 2023-08-16 DIAGNOSIS — K219 Gastro-esophageal reflux disease without esophagitis: Secondary | ICD-10-CM | POA: Insufficient documentation

## 2023-08-16 DIAGNOSIS — K5730 Diverticulosis of large intestine without perforation or abscess without bleeding: Secondary | ICD-10-CM | POA: Diagnosis not present

## 2023-08-16 DIAGNOSIS — F419 Anxiety disorder, unspecified: Secondary | ICD-10-CM | POA: Insufficient documentation

## 2023-08-16 HISTORY — PX: COLONOSCOPY: SHX5424

## 2023-08-16 LAB — HM COLONOSCOPY

## 2023-08-16 SURGERY — COLONOSCOPY
Anesthesia: General

## 2023-08-16 MED ORDER — LACTATED RINGERS IV SOLN: INTRAVENOUS | Status: DC | PRN

## 2023-08-16 MED ORDER — LIDOCAINE HCL (CARDIAC) PF 100 MG/5ML IV SOSY
PREFILLED_SYRINGE | INTRAVENOUS | Status: DC | PRN
  Administered 2023-08-16: 40 mg via INTRAVENOUS

## 2023-08-16 MED ORDER — PROPOFOL 10 MG/ML IV BOLUS
INTRAVENOUS | Status: DC | PRN
  Administered 2023-08-16: 40 mg via INTRAVENOUS
  Administered 2023-08-16: 80 ug/kg/min via INTRAVENOUS

## 2023-08-16 MED ORDER — LINACLOTIDE 72 MCG PO CAPS: 72.0000 ug | ORAL_CAPSULE | Freq: Every day | ORAL | 2 refills | Status: AC

## 2023-08-16 NOTE — Transfer of Care (Signed)
 Immediate Anesthesia Transfer of Care Note  Patient: Tami Love  Procedure(s) Performed: COLONOSCOPY  Patient Location: PACU  Anesthesia Type:General  Level of Consciousness: awake, alert , and oriented  Airway & Oxygen Therapy: Patient Spontanous Breathing  Post-op Assessment: Report given to RN and Post -op Vital signs reviewed and stable  Post vital signs: Reviewed and stable   Last Vitals:  Vitals Value Taken Time  BP    Temp    Pulse    Resp    SpO2      Last Pain:  Vitals:   08/16/23 1301  TempSrc:   PainSc: 3       Patients Stated Pain Goal: 4 (08/16/23 1058)  Complications: There were no known notable events for this encounter.

## 2023-08-16 NOTE — H&P (Signed)
 Primary Care Physician:  Carylon Perches, MD Primary Gastroenterologist:  Dr. Tasia Catchings  Pre-Procedure History & Physical: HPI:  Tami Love is a 78 y.o. female is here for a colonoscopy for colon cancer screening purposes.  Patient denies any family history of colorectal cancer.  No melena or hematochezia.  No abdominal pain or unintentional weight loss.  No change in bowel habits.  Overall feels well from a GI standpoint.  Last colonoscopy by Dr Karilyn Cota in 2018 . Suggested repeat 7 years    Past Medical History:  Diagnosis Date   Angina    remote history   Anxiety    Arthritis    Body aches 05/08/2014   Cancer (HCC) 2000   ovarian cancer   Colon polyps    GERD (gastroesophageal reflux disease)    Hemorrhoids    High cholesterol 07/15/2016   Hyperlipidemia    Skin cancer     Past Surgical History:  Procedure Laterality Date   ABDOMINAL HYSTERECTOMY  2000   ovarian   CATARACT EXTRACTION W/PHACO  04/10/2012   Procedure: CATARACT EXTRACTION PHACO AND INTRAOCULAR LENS PLACEMENT (IOC);  Surgeon: Gemma Payor, MD;  Location: [facility];  Service: Ophthalmology;  Laterality: Left;  CDE 17.03   COLONOSCOPY  04/29/2011   Procedure: COLONOSCOPY;  Surgeon: Malissa Hippo, MD;  Location: [facility];  Service: Endoscopy;  Laterality: N/A;  9:30   COLONOSCOPY N/A 07/30/2016   Procedure: COLONOSCOPY;  Surgeon: Malissa Hippo, MD;  Location: [facility];  Service: Endoscopy;  Laterality: N/A;  2:20   EYE SURGERY  2011   [facility]-right cataract   TOTAL ABDOMINAL HYSTERECTOMY W/ BILATERAL SALPINGOOPHORECTOMY  11/24/1998   TOTAL HIP ARTHROPLASTY  2010, 2007   bilateral    Prior to Admission medications   Medication Sig Start Date End Date Taking? Authorizing Provider  cetirizine (ZYRTEC) 10 MG tablet Take 10 mg by mouth daily.   Yes [provider]  sertraline (ZOLOFT) 100 MG tablet Take 100 mg by mouth daily. 06/26/19  Yes [provider]  simvastatin (ZOCOR) 40 MG tablet  Take 40 mg by mouth at bedtime.     Yes [provider]  Sod Picosulfate-Mag Ox-Cit Acd (CLENPIQ) 10-3.5-12 MG-GM -GM/175ML SOLN Take 1 kit by mouth as directed. 08/02/23  Yes Madoc Holquin, Juanetta Beets, MD  Sodium Sulfate-Mag Sulfate-KCl (SUTAB) [phone] MG TABS As directed 08/02/23  Yes Jetson Pickrel, Juanetta Beets, MD  acetaminophen (TYLENOL) 500 MG tablet Take 500 mg by mouth every 8 (eight) hours as needed for mild pain. Arthritis pain     [provider]  celecoxib (CELEBREX) 200 MG capsule Take 200 mg by mouth daily as needed (pain).     [provider]  clobetasol cream (TEMOVATE) 0.05 % Apply 1 application  topically 2 (two) times daily as needed (rash). Patient not taking: Reported on 07/28/2023 03/01/16   [provider]    Allergies as of 08/02/2023 - Review Complete 07/28/2023  Allergen Reaction Noted   Ivp dye [iodinated contrast media] Other (See Comments) 04/16/2011   Neosporin [neomycin-bacitracin zn-polymyx] Rash 04/06/2012    Family History  Problem Relation Age of Onset   Cancer Mother        uterine   Cancer Father        lung   Heart disease Sister        heart attack   Colon cancer Neg Hx     Social History   Socioeconomic History   Marital status: Married  Spouse name: Not on file   Number of children: Not on file   Years of education: Not on file   Highest education level: Not on file  Occupational History   Not on file  Tobacco Use   Smoking status: Never   Smokeless tobacco: Never  Vaping Use   Vaping status: Never Used  Substance and Sexual Activity   Alcohol use: Yes    Alcohol/week: 15.0 standard drinks of alcohol    Types: 5 Glasses of wine, 5 Cans of beer, 5 Standard drinks or equivalent per week    Comment: daily-   Drug use: No   Sexual activity: Not Currently    Birth control/protection: Surgical    Comment: hyst  Other Topics Concern   Not on file  Social History Narrative   Not on file   Social Drivers of  Health   Financial Resource Strain: Low Risk  (11/09/2022)   Overall Financial Resource Strain (CARDIA)    Difficulty of Paying Living Expenses: Not hard at all  Food Insecurity: No Food Insecurity (11/09/2022)   Hunger Vital Sign    Worried About Running Out of Food in the Last Year: Never true    Ran Out of Food in the Last Year: Never true  Transportation Needs: No Transportation Needs (11/09/2022)   PRAPARE - Administrator, Civil Service (Medical): No    Lack of Transportation (Non-Medical): No  Physical Activity: Inactive (11/09/2022)   Exercise Vital Sign    Days of Exercise per Week: 0 days    Minutes of Exercise per Session: 0 min  Stress: Stress Concern Present (11/09/2022)   [employer] of Occupational Health - Occupational Stress Questionnaire    Feeling of Stress : To some extent  Social Connections: Moderately Integrated (11/09/2022)   Social Connection and Isolation Panel [NHANES]    Frequency of Communication with Friends and Family: More than three times a week    Frequency of Social Gatherings with Friends and Family: More than three times a week    Attends Religious Services: More than 4 times per year    Active Member of [employer] or Organizations: No    Attends Banker Meetings: Never    Marital Status: Married  Catering manager Violence: Not At Risk (11/09/2022)   Humiliation, Afraid, Rape, and Kick questionnaire    Fear of Current or Ex-Partner: No    Emotionally Abused: No    Physically Abused: No    Sexually Abused: No    Review of Systems: See HPI, otherwise negative ROS  Physical Exam: Vital signs in last 24 hours: Temp:  [97.9 F (36.6 C)] 97.9 F (36.6 C) (03/18 1058) Pulse Rate:  [62] 62 (03/18 1058) Resp:  [14] 14 (03/18 1058) BP: (148)/(75) 148/75 (03/18 1058) SpO2:  [99 %] 99 % (03/18 1058) Weight:  [72.6 kg] 72.6 kg (03/18 1058)   General:   Alert,  Well-developed, well-nourished, pleasant and cooperative in  NAD Head:  Normocephalic and atraumatic. Eyes:  Sclera clear, no icterus.   Conjunctiva pink. Ears:  Normal auditory acuity. Nose:  No deformity, discharge,  or lesions. Msk:  Symmetrical without gross deformities. Normal posture. Extremities:  Without clubbing or edema. Neurologic:  Alert and  oriented x4;  grossly normal neurologically. Skin:  Intact without significant lesions or rashes. Psych:  Alert and cooperative. Normal mood and affect.  Impression/Plan: Tami Love is here for a colonoscopy to be performed for colon cancer screening purposes.  The  risks of the procedure including infection, bleed, or perforation as well as benefits, limitations, alternatives and imponderables have been reviewed with the patient. Questions have been answered. All parties agreeable.

## 2023-08-16 NOTE — Op Note (Signed)
 [facility] Patient Name: Tami Love Procedure Date: 08/16/2023 12:08 PM MRN: [id number] Date of Birth: Feb 14, 1946 Attending MD: Sanjuan Dame , MD, [id number] CSN: [id number] Age: 78 Admit Type: Outpatient Procedure:                Colonoscopy Indications:              Screening for colorectal malignant neoplasm Providers:                Sanjuan Dame, MD, Francoise Ceo RN, RN, Dyann Ruddle Referring MD:              Medicines:                Monitored Anesthesia Care Complications:            No immediate complications. Estimated Blood Loss:     Estimated blood loss: none. Procedure:                Pre-Anesthesia Assessment:                           - Prior to the procedure, a History and Physical                            was performed, and patient medications and                            allergies were reviewed. The patient's tolerance of                            previous anesthesia was also reviewed. The risks                            and benefits of the procedure and the sedation                            options and risks were discussed with the patient.                            All questions were answered, and informed consent                            was obtained. Prior Anticoagulants: The patient has                            taken no anticoagulant or antiplatelet agents                            except for aspirin. ASA Grade Assessment: III - A                            patient with severe systemic disease. After                            reviewing the risks and benefits, the patient was  deemed in satisfactory condition to undergo the                            procedure.                           After obtaining informed consent, the colonoscope                            was passed under direct vision. Throughout the                            procedure, the patient's blood pressure, pulse, and                             oxygen saturations were monitored continuously. The                            [id number]-HQ190L ([id number]) scope was introduced through                            the anus and advanced to the the cecum, identified                            by appendiceal orifice and ileocecal valve. The                            colonoscopy was performed without difficulty. The                            patient tolerated the procedure well. The quality                            of the bowel preparation was evaluated using the                            BBPS [facility] Bowel Preparation Scale) with scores                            of: Right Colon = 3, Transverse Colon = 3 and Left                            Colon = 3 (entire mucosa seen well with no residual                            staining, small fragments of stool or opaque                            liquid). The total BBPS score equals 9. The                            ileocecal valve, appendiceal orifice, and rectum  were photographed. Scope In: 1:12:46 PM Scope Out: 1:33:17 PM Scope Withdrawal Time: 0 hours 10 minutes 9 seconds  Total Procedure Duration: 0 hours 20 minutes 31 seconds  Findings:      The perianal and digital rectal examinations were normal.      Scattered large-mouthed diverticula were found in the entire colon.      An area of mildly erythematous mucosa was found in the rectum. Biopsies       were taken with a cold forceps for histology.      Non-bleeding internal hemorrhoids were found during retroflexion. The       hemorrhoids were medium-sized. Impression:               - Diverticulosis in the entire examined colon.                           - Erythematous mucosa in the rectum . Biopsied.                           - Non-bleeding internal hemorrhoids. Moderate Sedation:      Per Anesthesia Care Recommendation:           - Patient has a contact number available for                            emergencies.  The signs and symptoms of potential                            delayed complications were discussed with the                            patient. Return to normal activities tomorrow.                            Written discharge instructions were provided to the                            patient.                           - High fiber diet.                           - Continue present medications.                           - Await pathology results.                           - No repeat colonoscopy due to current age (25                            years or older).                           - Return to primary care physician as previously  scheduled. Procedure Code(s):        --- Professional ---                           [phone], Colonoscopy, flexible; with biopsy, single                            or multiple Diagnosis Code(s):        --- Professional ---                           Z12.11, Encounter for screening for malignant                            neoplasm of colon                           K64.8, Other hemorrhoids                           K62.89, Other specified diseases of anus and rectum                           K57.30, Diverticulosis of large intestine without                            perforation or abscess without bleeding CPT copyright 2022 American Medical Association. All rights reserved. The codes documented in this report are preliminary and upon coder review may  be revised to meet current compliance requirements. Sanjuan Dame, MD Sanjuan Dame, MD 08/16/2023 1:38:39 PM This report has been signed electronically. Number of Addenda: 0

## 2023-08-16 NOTE — Anesthesia Postprocedure Evaluation (Signed)
 Anesthesia Post Note  Patient: Tami Love  Procedure(s) Performed: COLONOSCOPY  Patient location during evaluation: PACU Anesthesia Type: General Level of consciousness: awake and alert Pain management: pain level controlled Vital Signs Assessment: post-procedure vital signs reviewed and stable Respiratory status: spontaneous breathing, nonlabored ventilation, respiratory function stable and patient connected to nasal cannula oxygen Cardiovascular status: blood pressure returned to baseline and stable Postop Assessment: no apparent nausea or vomiting Anesthetic complications: no   There were no known notable events for this encounter.   Last Vitals:  Vitals:   08/16/23 1058  BP: (!) 148/75  Pulse: 62  Resp: 14  Temp: 36.6 C  SpO2: 99%    Last Pain:  Vitals:   08/16/23 1301  TempSrc:   PainSc: 3                  Lafern Brinkley L Yarah Fuente

## 2023-08-16 NOTE — Discharge Instructions (Signed)
  Discharge instructions Please read the instructions outlined below and refer to this sheet in the next few weeks. These discharge instructions provide you with general information on caring for yourself after you leave the hospital. Your doctor may also give you specific instructions. While your treatment has been planned according to the most current medical practices available, unavoidable complications occasionally occur. If you have any problems or questions after discharge, please call your doctor. ACTIVITY You may resume your regular activity but move at a slower pace for the next 24 hours.  Take frequent rest periods for the next 24 hours.  Walking will help expel (get rid of) the air and reduce the bloated feeling in your abdomen.  No driving for 24 hours (because of the anesthesia (medicine) used during the test).  You may shower.  Do not sign any important legal documents or operate any machinery for 24 hours (because of the anesthesia used during the test).  NUTRITION Drink plenty of fluids.  You may resume your normal diet.  Begin with a light meal and progress to your normal diet.  Avoid alcoholic beverages for 24 hours or as instructed by your caregiver.  MEDICATIONS You may resume your normal medications unless your caregiver tells you otherwise.  WHAT YOU CAN EXPECT TODAY You may experience abdominal discomfort such as a feeling of fullness or "gas" pains.  FOLLOW-UP Your doctor will discuss the results of your test with you.  SEEK IMMEDIATE MEDICAL ATTENTION IF ANY OF THE FOLLOWING OCCUR: Excessive nausea (feeling sick to your stomach) and/or vomiting.  Severe abdominal pain and distention (swelling).  Trouble swallowing.  Temperature over 101 F (37.8 C).  Rectal bleeding or vomiting of blood.    Ensure adequate fluid intake: Aim for 8 glasses of water daily. Follow a high fiber diet: Include foods such as dates, prunes, pears, and kiwi. Use Metamucil twice a  day.    I hope you have a great rest of your week!   Vista Lawman , M.D.. Gastroenterology and Hepatology [facility]

## 2023-08-16 NOTE — Anesthesia Preprocedure Evaluation (Addendum)
 Anesthesia Evaluation  Patient identified by MRN, date of birth, ID band Patient awake    Reviewed: Allergy & Precautions, H&P , NPO status , Patient's Chart, lab work & pertinent test results, reviewed documented beta blocker date and time   History of Anesthesia Complications Negative for: history of anesthetic complications  Airway Mallampati: II  TM Distance: >3 FB Neck ROM: full    Dental no notable dental hx. (+) Teeth Intact, Dental Advisory Given   Pulmonary neg pulmonary ROS   Pulmonary exam normal breath sounds clear to auscultation       Cardiovascular Exercise Tolerance: Good + angina  Normal cardiovascular exam Rhythm:Regular Rate:Normal     Neuro/Psych   Anxiety     negative neurological ROS     GI/Hepatic negative GI ROS, Neg liver ROS,GERD  Medicated and Controlled,,  Endo/Other  negative endocrine ROS    Renal/GU negative Renal ROS  negative genitourinary   Musculoskeletal  (+) Arthritis , Osteoarthritis,    Abdominal   Peds  Hematology negative hematology ROS (+)   Anesthesia Other Findings Skin cancer. Ovarian cancer  Reproductive/Obstetrics negative OB ROS                             Anesthesia Physical Anesthesia Plan  ASA: 2  Anesthesia Plan: General   Post-op Pain Management: Minimal or no pain anticipated   Induction: Intravenous  PONV Risk Score and Plan: Propofol infusion  Airway Management Planned: Nasal Cannula and Natural Airway  Additional Equipment: None  Intra-op Plan:   Post-operative Plan:   Informed Consent: I have reviewed the patients History and Physical, chart, labs and discussed the procedure including the risks, benefits and alternatives for the proposed anesthesia with the patient or authorized representative who has indicated his/her understanding and acceptance.     Dental Advisory Given  Plan Discussed with:  CRNA  Anesthesia Plan Comments:         Anesthesia Quick Evaluation

## 2023-08-17 ENCOUNTER — Encounter (HOSPITAL_COMMUNITY): Payer: Self-pay | Admitting: Gastroenterology

## 2023-08-17 LAB — SURGICAL PATHOLOGY

## 2023-08-22 ENCOUNTER — Encounter (INDEPENDENT_AMBULATORY_CARE_PROVIDER_SITE_OTHER): Payer: Self-pay | Admitting: *Deleted

## 2024-03-01 ENCOUNTER — Other Ambulatory Visit (HOSPITAL_COMMUNITY): Payer: Self-pay | Admitting: Internal Medicine

## 2024-03-01 DIAGNOSIS — Z1231 Encounter for screening mammogram for malignant neoplasm of breast: Secondary | ICD-10-CM

## 2024-03-08 ENCOUNTER — Ambulatory Visit (HOSPITAL_COMMUNITY)

## 2024-03-21 ENCOUNTER — Ambulatory Visit (HOSPITAL_COMMUNITY)
Admission: RE | Admit: 2024-03-21 | Discharge: 2024-03-21 | Disposition: A | Discharge status: Home / Self Care | Source: Ambulatory Visit | Attending: Internal Medicine | Admitting: Internal Medicine

## 2024-03-21 ENCOUNTER — Encounter (HOSPITAL_COMMUNITY): Payer: Self-pay

## 2024-03-21 DIAGNOSIS — Z1231 Encounter for screening mammogram for malignant neoplasm of breast: Secondary | ICD-10-CM | POA: Diagnosis present
